# Patient Record
Sex: Male | Born: 1990 | Race: White | Hispanic: No | Marital: Single | Smoking: Current every day smoker
Health system: Southern US, Community
[De-identification: ages and names within clinical notes are randomized; demographics above are authoritative.]

## PROBLEM LIST (undated history)

## (undated) DIAGNOSIS — F329 Major depressive disorder, single episode, unspecified: Secondary | ICD-10-CM

## (undated) DIAGNOSIS — J45909 Unspecified asthma, uncomplicated: Secondary | ICD-10-CM

## (undated) DIAGNOSIS — E119 Type 2 diabetes mellitus without complications: Secondary | ICD-10-CM

## (undated) DIAGNOSIS — T7840XA Allergy, unspecified, initial encounter: Secondary | ICD-10-CM

## (undated) DIAGNOSIS — F32A Depression, unspecified: Secondary | ICD-10-CM

## (undated) DIAGNOSIS — R569 Unspecified convulsions: Secondary | ICD-10-CM

## (undated) DIAGNOSIS — I10 Essential (primary) hypertension: Secondary | ICD-10-CM

## (undated) DIAGNOSIS — B9681 Helicobacter pylori [H. pylori] as the cause of diseases classified elsewhere: Secondary | ICD-10-CM

## (undated) DIAGNOSIS — F419 Anxiety disorder, unspecified: Secondary | ICD-10-CM

## (undated) HISTORY — PX: TONSILLECTOMY: SUR1361

## (undated) HISTORY — DX: Type 2 diabetes mellitus without complications: E11.9

## (undated) HISTORY — DX: Allergy, unspecified, initial encounter: T78.40XA

## (undated) HISTORY — PX: TYMPANOSTOMY TUBE PLACEMENT: SHX32

## (undated) HISTORY — DX: Anxiety disorder, unspecified: F41.9

## (undated) HISTORY — DX: Unspecified asthma, uncomplicated: J45.909

## (undated) HISTORY — DX: Unspecified convulsions: R56.9

## (undated) HISTORY — DX: Depression, unspecified: F32.A

---
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
## 2019-01-26 ENCOUNTER — Other Ambulatory Visit: Payer: Self-pay

## 2019-01-26 ENCOUNTER — Encounter: Payer: Self-pay | Admitting: Nurse Practitioner

## 2019-01-26 ENCOUNTER — Ambulatory Visit: Payer: Medicaid Other | Admitting: Nurse Practitioner

## 2019-01-26 VITALS — BP 116/72 | HR 84 | Temp 98.3°F | Ht 65.0 in | Wt 171.0 lb

## 2019-01-26 DIAGNOSIS — Z9189 Other specified personal risk factors, not elsewhere classified: Secondary | ICD-10-CM | POA: Insufficient documentation

## 2019-01-26 DIAGNOSIS — F319 Bipolar disorder, unspecified: Secondary | ICD-10-CM | POA: Insufficient documentation

## 2019-01-26 DIAGNOSIS — Z1329 Encounter for screening for other suspected endocrine disorder: Secondary | ICD-10-CM | POA: Diagnosis not present

## 2019-01-26 DIAGNOSIS — I10 Essential (primary) hypertension: Secondary | ICD-10-CM | POA: Insufficient documentation

## 2019-01-26 DIAGNOSIS — G40909 Epilepsy, unspecified, not intractable, without status epilepticus: Secondary | ICD-10-CM | POA: Insufficient documentation

## 2019-01-26 DIAGNOSIS — F3161 Bipolar disorder, current episode mixed, mild: Secondary | ICD-10-CM

## 2019-01-26 DIAGNOSIS — E119 Type 2 diabetes mellitus without complications: Secondary | ICD-10-CM

## 2019-01-26 MED ORDER — CLONIDINE HCL 0.1 MG PO TABS: 0.1000 mg | ORAL_TABLET | Freq: Two times a day (BID) | ORAL | 1 refills | Status: DC | PRN

## 2019-01-26 MED ORDER — QUETIAPINE FUMARATE 25 MG PO TABS: 25.0000 mg | ORAL_TABLET | Freq: Every day | ORAL | 3 refills | Status: DC

## 2019-01-26 MED ORDER — GABAPENTIN 400 MG PO CAPS: 400.0000 mg | ORAL_CAPSULE | Freq: Three times a day (TID) | ORAL | 3 refills | Status: DC

## 2019-01-26 MED ORDER — LAMOTRIGINE 200 MG PO TABS: 200.0000 mg | ORAL_TABLET | Freq: Two times a day (BID) | ORAL | 3 refills | Status: DC

## 2019-01-26 MED ORDER — LISINOPRIL 10 MG PO TABS: 10.0000 mg | ORAL_TABLET | Freq: Every day | ORAL | 3 refills | Status: DC

## 2019-01-26 MED ORDER — VIIBRYD 20 MG PO TABS: 20.0000 mg | ORAL_TABLET | Freq: Every day | ORAL | 3 refills | Status: DC

## 2019-01-26 NOTE — Assessment & Plan Note (Signed)
Ongoing, continue Viibryd and Lamictal.  Discontinue Vraylar and Mirtazapine, return to Seroquel which benefited patient in past with mood and sleep.  No longer taking Xanax, will discontinue this and recommended continued non use of this.  Continue Clonidine as needed for panic.  Consider psychiatry referral if ongoing symptoms that are poorly controlled.  He denies SI/HI.

## 2019-01-26 NOTE — Assessment & Plan Note (Signed)
Ongoing, stable with BP below goal today.  Continue Lisinopril which offers kidney protection.  Monitor BP at home three mornings a week.  CMP today.

## 2019-01-26 NOTE — Progress Notes (Signed)
New Patient Office Visit  Subjective:  Patient ID: Michael Woodward, male    DOB: 10/19/1990  Age: 28 y.o. MRN: [id number]  CC:  Chief Complaint  Patient presents with   Establish Care    pt would like to discuss about high BP and blood sugar    HPI Michael Woodward presents for new patient visit to establish care.  Introduced to Designer, jewellery role and practice setting.  All questions answered.  Just moved here from [facility].  His husband just passed away, his husband was 20.  He passed 8 months ago and since this time he does endorse some grieving, but overall doing well.  Was receiving primary care in [facility] and recently moved to this area.  Is on disability due to Bipolar Disorder.    BIPOLAR DISORDER Has been on Viibryd 20 MG since January and this works well.  Denies racing thoughts or SI/HI.  Has used Trazodone before, but this was not beneficial.  States he currently takes Dietitian, but preferred Seroquel in past which he took at night for sleep and mood.  Currently taking Mirtazapine and does not like it, reports it does not work as well and he eats all of the time.  Uses Clonidine as needed of panic attacks, tried Xanax but did not like the way it made him feel, wishes to discontinue this.  Has 10 pills of Xanax left at home and does not plan on using them.  Reports his Bipolar shifts from depression to periods of manic, but is well-controlled at this time on Viirbryd. Mood status: stable Satisfied with current treatment?: yes Symptom severity: mild  Duration of current treatment : chronic Side effects: no Medication compliance: good compliance Psychotherapy/counseling:  none Previous psychiatric medications: multiple different medications Depressed mood: sometimes  Anxious mood: occasionally Anhedonia: no Significant weight loss or gain: no Insomnia: yes hard to fall asleep Fatigue: no Feelings of worthlessness or guilt: no Impaired concentration/indecisiveness:  no Suicidal ideations: no Hopelessness: no Crying spells: no Depression screen PHQ 2/9 01/26/2019  Decreased Interest 0  Down, Depressed, Hopeless 1  PHQ - 2 Score 1  Altered sleeping 2  Tired, decreased energy 2  Change in appetite 2  Feeling bad or failure about yourself  2  Trouble concentrating 1  Moving slowly or fidgety/restless 2  Suicidal thoughts 0  PHQ-9 Score 12    GAD 7 : Generalized Anxiety Score 01/26/2019  Nervous, Anxious, on Edge 1  Control/stop worrying 1  Worry too much - different things 1  Trouble relaxing 2  Restless 2  Easily annoyed or irritable 1  Afraid - awful might happen 0  Total GAD 7 Score 8  Anxiety Difficulty Somewhat difficult   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  Does not recall if had concussion first and then had seizure or vice versa.  Was placed on multiple different types of seizure medication, has been on Lamictal for 4 years.  He recalls his last seizure as last July 5th, missed dose of Lamictal.  He endorses if he missed dose then he will have seizure.  States the Lamictal both benefits his Bipolar and seizures.  Does not recall recent blood level.  DIABETES Last A1C at beginning of this year was 4.6%.  Was diagnosed March 2019, was 238 lbs and now is 171 lbs, has changed eating habits.  Was on Actos in past, until he started having hypoglycemia 40-50's.  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  Hypoglycemic episodes:no Polydipsia/polyuria: no Visual disturbance: no Chest pain: no Paresthesias: no Glucose Monitoring: yes  Accucheck frequency: Daily  Fasting glucose: 90's  Post prandial:  Evening:  Before meals: Taking Insulin?: no  Long acting insulin:  Short acting insulin: Blood Pressure Monitoring: not checking Retinal Examination: Not up to Date , not since last year Foot Exam: Not up to Date Pneumovax:  refuses Influenza: refuses Aspirin: no  Past Medical History:  Diagnosis Date   Allergy    Anxiety    Asthma    Depression    Diabetes mellitus without complication (HCC)    Seizures (HCC)     Past Surgical History:  Procedure Laterality Date   TONSILLECTOMY     TYMPANOSTOMY TUBE PLACEMENT      Family History  Problem Relation Age of Onset   Hypertension Mother    Diabetes Mother    COPD Maternal Grandmother     Social History   Socioeconomic History   Marital status: Single    Spouse name: Not on file   Number of children: Not on file   Years of education: Not on file   Highest education level: Not on file  Occupational History   Not on file  Social Needs   Financial resource strain: Very hard   Food insecurity    Worry: Never true    Inability: Never true   Transportation needs    Medical: No    Non-medical: No  Tobacco Use   Smoking status: Former Smoker    Packs/day: 2.00    Types: Cigarettes   Smokeless tobacco: Never Used  Substance and Sexual Activity   Alcohol use: Not Currently   Drug use: Not Currently   Sexual activity: Not Currently  Lifestyle   Physical activity    Days per week: 6 days    Minutes per session: 30 min   Stress: Only a little  Relationships   Social connections    Talks on phone: More than three times a week    Gets together: More than three times a week    Attends religious service: Never    Active member of club or organization: No    Attends meetings of clubs or organizations: Never    Relationship status: Not on file   Intimate partner violence    Fear of current or ex partner: No    Emotionally abused: No    Physically abused: No    Forced sexual activity: No  Other Topics Concern   Not on file  Social History Narrative   Not on file    ROS Review of Systems  Objective:   Today's Vitals: BP 116/72    Pulse 84    Temp 98.3 F (36.8 C) (Oral)    Ht 5\' 5"  (1.651 m)    Wt 171  lb (77.6 kg)    SpO2 99%    BMI 28.46 kg/m   Physical Exam  Assessment & Plan:   Problem List Items Addressed This Visit      Cardiovascular and Mediastinum   Essential hypertension    Ongoing, stable with BP below goal today.  Continue Lisinopril which offers kidney protection.  Monitor BP at home three mornings a week.  CMP today.      Relevant Medications   lisinopril (ZESTRIL) 10 MG tablet   cloNIDine (CATAPRES) 0.1 MG tablet     Endocrine   Type  2 diabetes mellitus without complication, without long-term current use of insulin (HCC) - Primary    Ongoing and stable with reported recent A1C of 4.6% per patient.  Continue off medication and focus on diet/exercise.  Obtain A1C, urine micro, CMP, lipid panel.  Referral to ophthalmology for diabetic eye exam.  Recommend continue to monitor BS at home daily.  Return in 4 weeks.       Relevant Medications   lisinopril (ZESTRIL) 10 MG tablet   Other Relevant Orders   Ambulatory referral to Ophthalmology   Microalbumin, Urine Waived   Comprehensive metabolic panel   Lipid Panel w/o Chol/HDL Ratio   HgB A1c     Nervous and Auditory   Seizure disorder (HCC)    Ongoing, well-controlled on Lamictal.  Recommend he take this every day and use pill box as reminder.  Obtain level today.  Consider neurology referral if increased seizure activity.      Relevant Medications   lamoTRIgine (LAMICTAL) 200 MG tablet   gabapentin (NEURONTIN) 400 MG capsule   Other Relevant Orders   Lamotrigine level     Other   Bipolar disorder (HCC)    Ongoing, continue Viibryd and Lamictal.  Discontinue Vraylar and Mirtazapine, return to Seroquel which benefited patient in past with mood and sleep.  No longer taking Xanax, will discontinue this and recommended continued non use of this.  Continue Clonidine as needed for panic.  Consider psychiatry referral if ongoing symptoms that are poorly controlled.  He denies SI/HI.      At risk for HIV due to  homosexual contact    Currently not sexually active, but at risk due to homosexuality.  Monitor sexual activity and recommend safe sex.  Obtain HIV testing next visit.       Other Visit Diagnoses    Thyroid disorder screen       Relevant Orders   TSH      Outpatient Encounter Medications as of 01/26/2019  Medication Sig   cloNIDine (CATAPRES) 0.1 MG tablet Take 1 tablet (0.1 mg total) by mouth 2 (two) times daily as needed (for panic attack).   gabapentin (NEURONTIN) 400 MG capsule Take 1 capsule (400 mg total) by mouth 3 (three) times daily.   lamoTRIgine (LAMICTAL) 200 MG tablet Take 1 tablet (200 mg total) by mouth 2 (two) times daily.   lisinopril (ZESTRIL) 10 MG tablet Take 1 tablet (10 mg total) by mouth daily.   Multiple Vitamin (MULTIVITAMIN PO) Take by mouth daily.   VIIBRYD 20 MG TABS Take 1 tablet (20 mg total) by mouth daily with breakfast.   [DISCONTINUED] cloNIDine (CATAPRES) 0.1 MG tablet TK 1 T PO BID PRF PANIC   [DISCONTINUED] gabapentin (NEURONTIN) 400 MG capsule TK ONE C PO TID   [DISCONTINUED] lamoTRIgine (LAMICTAL) 200 MG tablet TK 1 T PO BID   [DISCONTINUED] lisinopril (ZESTRIL) 10 MG tablet TK 1 T PO QD   [DISCONTINUED] mirtazapine (REMERON) 30 MG tablet TK 1/2 TO 1 T PO HS FOR SLP   [DISCONTINUED] VIIBRYD 20 MG TABS TK 1 T PO QAM WC   [DISCONTINUED] VRAYLAR capsule TK ONE C PO QHS   QUEtiapine (SEROQUEL) 25 MG tablet Take 1 tablet (25 mg total) by mouth at bedtime.   [DISCONTINUED] ALPRAZolam (XANAX) 0.5 MG tablet Take 0.5 mg by mouth at bedtime as needed for anxiety.   No facility-administered encounter medications on file as of 01/26/2019.     Follow-up: Return in about 4 weeks (around 02/23/2019) for Follow-up .  Marjie SkiffJOLENE T Timya Trimmer, NP

## 2019-01-26 NOTE — Assessment & Plan Note (Signed)
Ongoing and stable with reported recent A1C of 4.6% per patient.  Continue off medication and focus on diet/exercise.  Obtain A1C, urine micro, CMP, lipid panel.  Referral to ophthalmology for diabetic eye exam.  Recommend continue to monitor BS at home daily.  Return in 4 weeks.

## 2019-01-26 NOTE — Assessment & Plan Note (Signed)
Ongoing, well-controlled on Lamictal.  Recommend he take this every day and use pill box as reminder.  Obtain level today.  Consider neurology referral if increased seizure activity.

## 2019-01-26 NOTE — Assessment & Plan Note (Signed)
Currently not sexually active, but at risk due to homosexuality.  Monitor sexual activity and recommend safe sex.  Obtain HIV testing next visit.

## 2019-01-26 NOTE — Patient Instructions (Signed)
Carbohydrate Counting for Diabetes Mellitus, Adult  Carbohydrate counting is a method of keeping track of how many carbohydrates you eat. Eating carbohydrates naturally increases the amount of sugar (glucose) in the blood. Counting how many carbohydrates you eat helps keep your blood glucose within normal limits, which helps you manage your diabetes (diabetes mellitus). It is important to know how many carbohydrates you can safely have in each meal. This is different for every person. A diet and nutrition specialist (registered dietitian) can help you make a meal plan and calculate how many carbohydrates you should have at each meal and snack. Carbohydrates are found in the following foods:  Grains, such as breads and cereals.  Dried beans and soy products.  Starchy vegetables, such as potatoes, peas, and corn.  Fruit and fruit juices.  Milk and yogurt.  Sweets and snack foods, such as cake, cookies, candy, chips, and soft drinks. How do I count carbohydrates? There are two ways to count carbohydrates in food. You can use either of the methods or a combination of both. Reading "Nutrition Facts" on packaged food The "Nutrition Facts" list is included on the labels of almost all packaged foods and beverages in the [place] It includes:  The serving size.  Information about nutrients in each serving, including the grams (g) of carbohydrate per serving. To use the "Nutrition Facts":  Decide how many servings you will have.  Multiply the number of servings by the number of carbohydrates per serving.  The resulting number is the total amount of carbohydrates that you will be having. Learning standard serving sizes of other foods When you eat carbohydrate foods that are not packaged or do not include "Nutrition Facts" on the label, you need to measure the servings in order to count the amount of carbohydrates:  Measure the foods that you will eat with a food scale or measuring cup, if needed.   Decide how many standard-size servings you will eat.  Multiply the number of servings by 15. Most carbohydrate-rich foods have about 15 g of carbohydrates per serving. ? For example, if you eat 8 oz (170 g) of strawberries, you will have eaten 2 servings and 30 g of carbohydrates (2 servings x 15 g = 30 g).  For foods that have more than one food mixed, such as soups and casseroles, you must count the carbohydrates in each food that is included. The following list contains standard serving sizes of common carbohydrate-rich foods. Each of these servings has about 15 g of carbohydrates:   hamburger bun or  English muffin.   oz (15 mL) syrup.   oz (14 g) jelly.  1 slice of bread.  1 six-inch tortilla.  3 oz (85 g) cooked rice or pasta.  4 oz (113 g) cooked dried beans.  4 oz (113 g) starchy vegetable, such as peas, corn, or potatoes.  4 oz (113 g) hot cereal.  4 oz (113 g) mashed potatoes or  of a large baked potato.  4 oz (113 g) canned or frozen fruit.  4 oz (120 mL) fruit juice.  4-6 crackers.  6 chicken nuggets.  6 oz (170 g) unsweetened dry cereal.  6 oz (170 g) plain fat-free yogurt or yogurt sweetened with artificial sweeteners.  8 oz (240 mL) milk.  8 oz (170 g) fresh fruit or one small piece of fruit.  24 oz (680 g) popped popcorn. Example of carbohydrate counting Sample meal  3 oz (85 g) chicken breast.  6 oz (170 g)   brown rice.  4 oz (113 g) corn.  8 oz (240 mL) milk.  8 oz (170 g) strawberries with sugar-free whipped topping. Carbohydrate calculation 1. Identify the foods that contain carbohydrates: ? Rice. ? Corn. ? Milk. ? Strawberries. 2. Calculate how many servings you have of each food: ? 2 servings rice. ? 1 serving corn. ? 1 serving milk. ? 1 serving strawberries. 3. Multiply each number of servings by 15 g: ? 2 servings rice x 15 g = 30 g. ? 1 serving corn x 15 g = 15 g. ? 1 serving milk x 15 g = 15 g. ? 1 serving  strawberries x 15 g = 15 g. 4. Add together all of the amounts to find the total grams of carbohydrates eaten: ? 30 g + 15 g + 15 g + 15 g = 75 g of carbohydrates total. Summary  Carbohydrate counting is a method of keeping track of how many carbohydrates you eat.  Eating carbohydrates naturally increases the amount of sugar (glucose) in the blood.  Counting how many carbohydrates you eat helps keep your blood glucose within normal limits, which helps you manage your diabetes.  A diet and nutrition specialist (registered dietitian) can help you make a meal plan and calculate how many carbohydrates you should have at each meal and snack. This information is not intended to replace advice given to you by your health care provider. Make sure you discuss any questions you have with your health care provider. Document Released: 06/04/2005 Document Revised: 12/27/2016 Document Reviewed: 11/16/2015 Elsevier Patient Education  2020 [employer]  

## 2019-01-27 LAB — COMPREHENSIVE METABOLIC PANEL
ALT: 14 IU/L (ref 0–44)
AST: 14 IU/L (ref 0–40)
Albumin/Globulin Ratio: 2.2 (ref 1.2–2.2)
Albumin: 5 g/dL (ref 4.1–5.2)
Alkaline Phosphatase: 78 IU/L (ref 39–117)
BUN/Creatinine Ratio: 18 (ref 9–20)
BUN: 15 mg/dL (ref 6–20)
Bilirubin Total: <0.2 mg/dL (ref 0.0–1.2)
CO2: 22 mmol/L (ref 20–29)
Calcium: 10 mg/dL (ref 8.7–10.2)
Chloride: 100 mmol/L (ref 96–106)
Creatinine, Ser: 0.83 mg/dL (ref 0.76–1.27)
GFR calc Af Amer: 139 mL/min/{1.73_m2} (ref >59)
GFR calc non Af Amer: 121 mL/min/{1.73_m2} (ref >59)
Globulin, Total: 2.3 g/dL (ref 1.5–4.5)
Glucose: 75 mg/dL (ref 65–99)
Potassium: 5 mmol/L (ref 3.5–5.2)
Sodium: 139 mmol/L (ref 134–144)
Total Protein: 7.3 g/dL (ref 6.0–8.5)

## 2019-01-27 LAB — LIPID PANEL W/O CHOL/HDL RATIO
Cholesterol, Total: 170 mg/dL (ref 100–199)
HDL: 36 mg/dL — ABNORMAL LOW (ref >39)
LDL Calculated: 90 mg/dL (ref 0–99)
Triglycerides: 222 mg/dL — ABNORMAL HIGH (ref 0–149)
VLDL Cholesterol Cal: 44 mg/dL — ABNORMAL HIGH (ref 5–40)

## 2019-01-27 LAB — TSH: TSH: 1.01 u[IU]/mL (ref 0.450–4.500)

## 2019-01-27 LAB — HEMOGLOBIN A1C
Est. average glucose Bld gHb Est-mCnc: 97 mg/dL
Hgb A1c MFr Bld: 5 % (ref 4.8–5.6)

## 2019-01-27 LAB — MICROALBUMIN, URINE WAIVED
Creatinine, Urine Waived: 50 mg/dL (ref 10–300)
Microalb, Ur Waived: 10 mg/L (ref 0–19)
Microalb/Creat Ratio: <30 mg/g (ref <30)

## 2019-03-06 ENCOUNTER — Ambulatory Visit: Payer: Medicaid Other | Admitting: Nurse Practitioner

## 2019-03-06 ENCOUNTER — Other Ambulatory Visit: Payer: Self-pay

## 2019-03-06 ENCOUNTER — Encounter: Payer: Self-pay | Admitting: Nurse Practitioner

## 2019-03-06 VITALS — BP 107/65 | HR 67 | Temp 98.1°F | Ht 65.0 in | Wt 166.0 lb

## 2019-03-06 DIAGNOSIS — G43909 Migraine, unspecified, not intractable, without status migrainosus: Secondary | ICD-10-CM | POA: Insufficient documentation

## 2019-03-06 DIAGNOSIS — Z23 Encounter for immunization: Secondary | ICD-10-CM | POA: Diagnosis not present

## 2019-03-06 DIAGNOSIS — G40909 Epilepsy, unspecified, not intractable, without status epilepticus: Secondary | ICD-10-CM

## 2019-03-06 DIAGNOSIS — G43109 Migraine with aura, not intractable, without status migrainosus: Secondary | ICD-10-CM | POA: Diagnosis not present

## 2019-03-06 DIAGNOSIS — I10 Essential (primary) hypertension: Secondary | ICD-10-CM

## 2019-03-06 DIAGNOSIS — E119 Type 2 diabetes mellitus without complications: Secondary | ICD-10-CM | POA: Diagnosis not present

## 2019-03-06 DIAGNOSIS — Z9189 Other specified personal risk factors, not elsewhere classified: Secondary | ICD-10-CM

## 2019-03-06 MED ORDER — RIZATRIPTAN BENZOATE 5 MG PO TABS: 5.0000 mg | ORAL_TABLET | ORAL | 0 refills | Status: DC | PRN

## 2019-03-06 NOTE — Assessment & Plan Note (Signed)
Ongoing, well-controlled on Lamictal.  Recommend he take this every day and use pill box as reminder.  Obtain level today.  Consider neurology referral if increased seizure activity.

## 2019-03-06 NOTE — Assessment & Plan Note (Signed)
Ongoing, stable with BP below goal today.  Continue Lisinopril which offers kidney protection.  Monitor BP at home three mornings a week.

## 2019-03-06 NOTE — Assessment & Plan Note (Addendum)
Chronic issue, has not taken medication in long while, but migraines returned over past year.  Currently he does not feel need for preventative and would be cautious using Amitriptyline due to him being on multiple psych medications.  Script sent for Maxalt to use as needed.  Return in 4 weeks.

## 2019-03-06 NOTE — Assessment & Plan Note (Signed)
Declines HIV testing today, recommended this.  At this time not sexually active and reports negative test one year ago.  He is aware of need for testing if becomes sexually active.

## 2019-03-06 NOTE — Progress Notes (Addendum)
BP 107/65    Pulse 67    Temp 98.1 F (36.7 C) (Oral)    Ht 5\' 5"  (1.651 m)    Wt 166 lb (75.3 kg)    SpO2 99%    BMI 27.62 kg/m    Subjective:    Patient ID: Michael Woodward, male    DOB: 1991/05/20, 28 y.o.   MRN: [id number]  HPI: Michael Woodward is a 28 y.o. male  Chief Complaint  Patient presents with   Diabetes   Hypertension   DIABETES Last A1C at beginning of this year was 5.0%.  Was diagnosed March 2019, was 238 lbs and now is 171 lbs, has changed eating habits.  Was on Actos in past, until he started having hypoglycemia 40-50's.  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  Hypoglycemic episodes:no Polydipsia/polyuria: no Visual disturbance: no Chest pain: no Paresthesias: no Glucose Monitoring: yes  Accucheck frequency: Daily  Fasting glucose: 98 to 120  Post prandial:  Evening:  Before meals: Taking Insulin?: no  Long acting insulin:  Short acting insulin: Blood Pressure Monitoring: not checking Retinal Examination: October 4th, 2020 Foot Exam: Up to Date Pneumovax: Up to Date Influenza: Up to Date Aspirin: no   HYPERTENSION Continues on Lisinopril 10 MG daily. Hypertension status: stable  Satisfied with current treatment? yes Duration of hypertension: chronic BP monitoring frequency:  not checking BP range:  BP medication side effects:  no Medication compliance: good compliance Aspirin: no Recurrent headaches: no Visual changes: no Palpitations: no Dyspnea: no Chest pain: no Lower extremity edema: no Dizzy/lightheaded: no   SEIZURE DISORDER: Diagnosed in high school, does not recall what happened.  Does not recall if had concussion first and then had seizure or vice versa.  Was placed on multiple different types of seizure medication, has been on Lamictal for 4 years.  He recalls his last seizure as July 5th 2019, missed dose of Lamictal. He reports if he misses dose  then he will have seizure.  States the Lamictal both benefits his Bipolar and seizures.  Does not recall recent blood level.  MIGRAINES Started having them in high school.  Used to take Amitriptyline.  Stopped in 2014 and migraines have returned over the past year.   Duration: years Onset: gradual Frequency: intermittent Location: frontal and into eyes Headache duration: Radiation: no Time of day headache occurs: varies Alleviating factors: nothing Aggravating factors: unknown Headache status at time of visit: asymptomatic Treatments attempted: Treatments attempted: amitriptyline   Aura: yes Nausea:  yes Vomiting: yes Photophobia:  yes Phonophobia:  yes Effect on social functioning:  yes Numbers of missed days of school/work each month: none Confusion:  no Gait disturbance/ataxia:  no Behavioral changes:  no Fevers:  no  Relevant past medical, surgical, family and social history reviewed and updated as indicated. Interim medical history since our last visit reviewed. Allergies and medications reviewed and updated.  Review of Systems  Constitutional: Negative for activity change, diaphoresis, fatigue and fever.  Respiratory: Negative for cough, chest tightness, shortness of breath and wheezing.   Cardiovascular: Negative for chest pain, palpitations and leg swelling.  Gastrointestinal: Negative for abdominal distention, abdominal pain, constipation, diarrhea, nausea and vomiting.  Endocrine: Negative for cold intolerance, heat intolerance, polydipsia, polyphagia and polyuria.  Neurological: Negative for dizziness, syncope, weakness, light-headedness, numbness and headaches (not present today).    Per HPI unless specifically indicated above     Objective:  BP 107/65    Pulse 67    Temp 98.1 F (36.7 C) (Oral)    Ht 5\' 5"  (1.651 m)    Wt 166 lb (75.3 kg)    SpO2 99%    BMI 27.62 kg/m   Wt Readings from Last 3 Encounters:  03/06/19 166 lb (75.3 kg)  01/26/19 171 lb  (77.6 kg)    Physical Exam Vitals signs and nursing note reviewed.  Constitutional:      General: He is awake. He is not in acute distress.    Appearance: He is well-developed. He is not ill-appearing.  HENT:     Head: Normocephalic and atraumatic.     Right Ear: Hearing normal. No drainage.     Left Ear: Hearing normal. No drainage.     Mouth/Throat:     Pharynx: Uvula midline.  Eyes:     General: Lids are normal.        Right eye: No discharge.        Left eye: No discharge.     Conjunctiva/sclera: Conjunctivae normal.     Pupils: Pupils are equal, round, and reactive to light.  Neck:     Musculoskeletal: Normal range of motion and neck supple.     Thyroid: No thyromegaly.     Vascular: No carotid bruit.  Cardiovascular:     Rate and Rhythm: Normal rate and regular rhythm.     Heart sounds: Normal heart sounds, S1 normal and S2 normal. No murmur. No gallop.   Pulmonary:     Effort: Pulmonary effort is normal. No accessory muscle usage or respiratory distress.     Breath sounds: Normal breath sounds.  Abdominal:     General: Bowel sounds are normal.     Palpations: Abdomen is soft.  Musculoskeletal: Normal range of motion.     Right lower leg: No edema.     Left lower leg: No edema.  Skin:    General: Skin is warm and dry.  Neurological:     Mental Status: He is alert and oriented to person, place, and time.     Cranial Nerves: Cranial nerves are intact.     Deep Tendon Reflexes: Reflexes are normal and symmetric.     Reflex Scores:      Brachioradialis reflexes are 2+ on the right side and 2+ on the left side.      Patellar reflexes are 2+ on the right side and 2+ on the left side. Psychiatric:        Mood and Affect: Mood normal.        Behavior: Behavior normal. Behavior is cooperative.        Thought Content: Thought content normal.        Judgment: Judgment normal.    Diabetic Foot Exam - Simple   Simple Foot Form Visual Inspection See comments:  Yes Sensation Testing Intact to touch and monofilament testing bilaterally: Yes Pulse Check Posterior Tibialis and Dorsalis pulse intact bilaterally: Yes Comments Onychomycosis bilaterally.    Results for orders placed or performed in visit on 01/26/19  Microalbumin, Urine Waived  Result Value Ref Range   Microalb, Ur Waived 10 0 - 19 mg/L   Creatinine, Urine Waived 50 10 - 300 mg/dL   Microalb/Creat Ratio <30 <30 mg/g  Comprehensive metabolic panel  Result Value Ref Range   Glucose 75 65 - 99 mg/dL   BUN 15 6 - 20 mg/dL   Creatinine, Ser 0.83 0.76 - 1.27 mg/dL   GFR  calc non Af Amer 121 >59 mL/min/1.73   GFR calc Af Amer 139 >59 mL/min/1.73   BUN/Creatinine Ratio 18 9 - 20   Sodium 139 134 - 144 mmol/L   Potassium 5.0 3.5 - 5.2 mmol/L   Chloride 100 96 - 106 mmol/L   CO2 22 20 - 29 mmol/L   Calcium 10.0 8.7 - 10.2 mg/dL   Total Protein 7.3 6.0 - 8.5 g/dL   Albumin 5.0 4.1 - 5.2 g/dL   Globulin, Total 2.3 1.5 - 4.5 g/dL   Albumin/Globulin Ratio 2.2 1.2 - 2.2   Bilirubin Total <0.2 0.0 - 1.2 mg/dL   Alkaline Phosphatase 78 39 - 117 IU/L   AST 14 0 - 40 IU/L   ALT 14 0 - 44 IU/L  TSH  Result Value Ref Range   TSH 1.010 0.450 - 4.500 uIU/mL  Lipid Panel w/o Chol/HDL Ratio  Result Value Ref Range   Cholesterol, Total 170 100 - 199 mg/dL   Triglycerides 161222 (H) 0 - 149 mg/dL   HDL 36 (L) >09>39 mg/dL   VLDL Cholesterol Cal 44 (H) 5 - 40 mg/dL   LDL Calculated 90 0 - 99 mg/dL  HgB U0AA1c  Result Value Ref Range   Hgb A1c MFr Bld 5.0 4.8 - 5.6 %   Est. average glucose Bld gHb Est-mCnc 97 mg/dL      Assessment & Plan:   Problem List Items Addressed This Visit      Cardiovascular and Mediastinum   Essential hypertension    Ongoing, stable with BP below goal today.  Continue Lisinopril which offers kidney protection.  Monitor BP at home three mornings a week.        Migraines    Chronic issue, has not taken medication in long while, but migraines returned over past year.   Currently he does not feel need for preventative and would be cautious using Amitriptyline due to him being on multiple psych medications.  Script sent for Maxalt to use as needed.  Return in 4 weeks.      Relevant Medications   rizatriptan (MAXALT) 5 MG tablet     Endocrine   Type 2 diabetes mellitus without complication, without long-term current use of insulin (HCC) - Primary    Ongoing and stable with A1C 5.0% recent labs.  Continue off medication and focus on diet/exercise.   Recommend continue to monitor BS at home daily.  Praised for success.        Nervous and Auditory   Seizure disorder (HCC)    Ongoing, well-controlled on Lamictal.  Recommend he take this every day and use pill box as reminder.  Obtain level today.  Consider neurology referral if increased seizure activity.      Relevant Medications   rizatriptan (MAXALT) 5 MG tablet   Other Relevant Orders   Lamotrigine level     Other   At risk for HIV due to homosexual contact    Declines HIV testing today, recommended this.  At this time not sexually active and reports negative test one year ago.  He is aware of need for testing if becomes sexually active.       Other Visit Diagnoses    Flu vaccine need       Relevant Orders   Flu Vaccine QUAD 36+ mos IM (Completed)   Need for pneumococcal vaccine       Relevant Orders   Pneumococcal polysaccharide vaccine 23-valent greater than or equal to 2yo subcutaneous/IM (Completed)  Follow up plan: Return in about 4 weeks (around 04/03/2019) for Migraines.

## 2019-03-06 NOTE — Assessment & Plan Note (Signed)
Ongoing and stable with A1C 5.0% recent labs.  Continue off medication and focus on diet/exercise.   Recommend continue to monitor BS at home daily.  Praised for success.

## 2019-03-06 NOTE — Patient Instructions (Signed)

## 2019-03-09 LAB — LAMOTRIGINE LEVEL: Lamotrigine Lvl: 8.3 ug/mL (ref 2.0–20.0)

## 2019-03-23 LAB — HM DIABETES EYE EXAM

## 2019-04-10 ENCOUNTER — Other Ambulatory Visit: Payer: Self-pay

## 2019-04-10 ENCOUNTER — Encounter: Payer: Self-pay | Admitting: Nurse Practitioner

## 2019-04-10 ENCOUNTER — Ambulatory Visit (INDEPENDENT_AMBULATORY_CARE_PROVIDER_SITE_OTHER): Payer: Medicaid Other | Admitting: Nurse Practitioner

## 2019-04-10 VITALS — BP 102/72 | HR 86 | Temp 98.0°F | Wt 165.0 lb

## 2019-04-10 DIAGNOSIS — F3161 Bipolar disorder, current episode mixed, mild: Secondary | ICD-10-CM | POA: Diagnosis not present

## 2019-04-10 DIAGNOSIS — I10 Essential (primary) hypertension: Secondary | ICD-10-CM

## 2019-04-10 DIAGNOSIS — G43109 Migraine with aura, not intractable, without status migrainosus: Secondary | ICD-10-CM

## 2019-04-10 MED ORDER — QUETIAPINE FUMARATE 25 MG PO TABS: 25.0000 mg | ORAL_TABLET | Freq: Every day | ORAL | 3 refills | Status: DC

## 2019-04-10 MED ORDER — LAMOTRIGINE 200 MG PO TABS: 200.0000 mg | ORAL_TABLET | Freq: Two times a day (BID) | ORAL | 3 refills | Status: DC

## 2019-04-10 MED ORDER — QUETIAPINE FUMARATE 50 MG PO TABS: 50.0000 mg | ORAL_TABLET | Freq: Every day | ORAL | 3 refills | Status: DC

## 2019-04-10 MED ORDER — LISINOPRIL 2.5 MG PO TABS: 2.5000 mg | ORAL_TABLET | Freq: Every day | ORAL | 3 refills | Status: DC

## 2019-04-10 NOTE — Assessment & Plan Note (Signed)
Chronic, stable with improvement reported.  Minimal use of Maxalt, continue current regimen and adjust as needed.  Would avoid Amitriptyline due to pt being on multiple psych medications.

## 2019-04-10 NOTE — Patient Instructions (Signed)
Living With Depression Everyone experiences occasional disappointment, sadness, and loss in their lives. When you are feeling down, blue, or sad for at least 2 weeks in a row, it may mean that you have depression. Depression can affect your thoughts and feelings, relationships, daily activities, and physical health. It is caused by changes in the way your brain functions. If you receive a diagnosis of depression, your health care provider will tell you which type of depression you have and what treatment options are available to you. If you are living with depression, there are ways to help you recover from it and also ways to prevent it from coming back. How to cope with lifestyle changes Coping with stress     Stress is your body's reaction to life changes and events, both good and bad. Stressful situations may include:  Getting married.  The death of a spouse.  Losing a job.  Retiring.  Having a baby. Stress can last just a few hours or it can be ongoing. Stress can play a major role in depression, so it is important to learn both how to cope with stress and how to think about it differently. Talk with your health care provider or a counselor if you would like to learn more about stress reduction. He or she may suggest some stress reduction techniques, such as:  Music therapy. This can include creating music or listening to music. Choose music that you enjoy and that inspires you.  Mindfulness-based meditation. This kind of meditation can be done while sitting or walking. It involves being aware of your normal breaths, rather than trying to control your breathing.  Centering prayer. This is a kind of meditation that involves focusing on a spiritual word or phrase. Choose a word, phrase, or sacred image that is meaningful to you and that brings you peace.  Deep breathing. To do this, expand your stomach and inhale slowly through your nose. Hold your breath for 3-5 seconds, then exhale  slowly, allowing your stomach muscles to relax.  Muscle relaxation. This involves intentionally tensing muscles then relaxing them. Choose a stress reduction technique that fits your lifestyle and personality. Stress reduction techniques take time and practice to develop. Set aside 5-15 minutes a day to do them. Therapists can offer training in these techniques. The training may be covered by some insurance plans. Other things you can do to manage stress include:  Keeping a stress diary. This can help you learn what triggers your stress and ways to control your response.  Understanding what your limits are and saying no to requests or events that lead to a schedule that is too full.  Thinking about how you respond to certain situations. You may not be able to control everything, but you can control how you react.  Adding humor to your life by watching funny films or TV shows.  Making time for activities that help you relax and not feeling guilty about spending your time this way.  Medicines Your health care provider may suggest certain medicines if he or she feels that they will help improve your condition. Avoid using alcohol and other substances that may prevent your medicines from working properly (may interact). It is also important to:  Talk with your pharmacist or health care provider about all the medicines that you take, their possible side effects, and what medicines are safe to take together.  Make it your goal to take part in all treatment decisions (shared decision-making). This includes giving input on   the side effects of medicines. It is best if shared decision-making with your health care provider is part of your total treatment plan. If your health care provider prescribes a medicine, you may not notice the full benefits of it for 4-8 weeks. Most people who are treated for depression need to be on medicine for at least 6-12 months after they feel better. If you are taking  medicines as part of your treatment, do not stop taking medicines without first talking to your health care provider. You may need to have the medicine slowly decreased (tapered) over time to decrease the risk of harmful side effects. Relationships Your health care provider may suggest family therapy along with individual therapy and drug therapy. While there may not be family problems that are causing you to feel depressed, it is still important to make sure your family learns as much as they can about your mental health. Having your family's support can help make your treatment successful. How to recognize changes in your condition Everyone has a different response to treatment for depression. Recovery from major depression happens when you have not had signs of major depression for two months. This may mean that you will start to:  Have more interest in doing activities.  Feel less hopeless than you did 2 months ago.  Have more energy.  Overeat less often, or have better or improving appetite.  Have better concentration. Your health care provider will work with you to decide the next steps in your recovery. It is also important to recognize when your condition is getting worse. Watch for these signs:  Having fatigue or low energy.  Eating too much or too little.  Sleeping too much or too little.  Feeling restless, agitated, or hopeless.  Having trouble concentrating or making decisions.  Having unexplained physical complaints.  Feeling irritable, angry, or aggressive. Get help as soon as you or your family members notice these symptoms coming back. How to get support and help from others How to talk with friends and family members about your condition  Talking to friends and family members about your condition can provide you with one way to get support and guidance. Reach out to trusted friends or family members, explain your symptoms to them, and let them know that you are  working with a health care provider to treat your depression. Financial resources Not all insurance plans cover mental health care, so it is important to check with your insurance carrier. If paying for co-pays or counseling services is a problem, search for a local or county mental health care center. They may be able to offer public mental health care services at low or no cost when you are not able to see a private health care provider. If you are taking medicine for depression, you may be able to get the generic form, which may be less expensive. Some makers of prescription medicines also offer help to patients who cannot afford the medicines they need. Follow these instructions at home:   Get the right amount and quality of sleep.  Cut down on using caffeine, tobacco, alcohol, and other potentially harmful substances.  Try to exercise, such as walking or lifting small weights.  Take over-the-counter and prescription medicines only as told by your health care provider.  Eat a healthy diet that includes plenty of vegetables, fruits, whole grains, low-fat dairy products, and lean protein. Do not eat a lot of foods that are high in solid fats, added sugars, or salt.    Keep all follow-up visits as told by your health care provider. This is important. Contact a health care provider if:  You stop taking your antidepressant medicines, and you have any of these symptoms: ? Nausea. ? Headache. ? Feeling lightheaded. ? Chills and body aches. ? Not being able to sleep (insomnia).  You or your friends and family think your depression is getting worse. Get help right away if:  You have thoughts of hurting yourself or others. If you ever feel like you may hurt yourself or others, or have thoughts about taking your own life, get help right away. You can go to your nearest emergency department or call:  Your local emergency services (911 in the [place]  A suicide crisis helpline, such as the  National Suicide Prevention Lifeline at 1-800-273-8255. This is open 24-hours a day. Summary  If you are living with depression, there are ways to help you recover from it and also ways to prevent it from coming back.  Work with your health care team to create a management plan that includes counseling, stress management techniques, and healthy lifestyle habits. This information is not intended to replace advice given to you by your health care provider. Make sure you discuss any questions you have with your health care provider. Document Released: 05/07/2016 Document Revised: 09/26/2018 Document Reviewed: 05/07/2016 [employer] Patient Education  2020 [employer]  

## 2019-04-10 NOTE — Progress Notes (Signed)
BP 102/72 (BP Location: Left Arm, Patient Position: Sitting)    Pulse 86    Temp 98 F (36.7 C) (Oral)    Wt 165 lb (74.8 kg)    SpO2 98%    BMI 27.46 kg/m    Subjective:    Patient ID: Michael Woodward, male    DOB: 1991-03-15, 28 y.o.   MRN: [id number]  HPI: Michael Woodward is a 28 y.o. male  Chief Complaint  Patient presents with   Migraine    4 week f/up   MIGRAINES Started having them in high school.  Used to take Amitriptyline.  Stopped in 2014 and migraines have returned over the past year.  Started on Maxalt as needed last visit.  Has had to use twice only since last visit and reports headaches have improved.   Duration: years Onset: gradual Frequency: intermittent Location: frontal and into eyes Headache duration: Radiation: no Time of day headache occurs: varies Alleviating factors: nothing Aggravating factors: unknown Headache status at time of visit: asymptomatic Treatments attempted: Treatments attempted: amitriptyline   Aura: yes Nausea:  yes Vomiting: yes Photophobia:  yes Phonophobia:  yes Effect on social functioning:  yes Numbers of missed days of school/work each month: none Confusion:  no Gait disturbance/ataxia:  no Behavioral changes:  no Fevers:  no  HYPERTENSION Continues on Lisinopril 10 MG daily. Hypertension status: stable  Satisfied with current treatment? yes Duration of hypertension: chronic BP monitoring frequency:  not checking BP range: 120/88 in morning -- occasionally higher in evening BP medication side effects:  no Medication compliance: good compliance Aspirin: no Recurrent headaches: no Visual changes: no Palpitations: no Dyspnea: no Chest pain: no Lower extremity edema: no Dizzy/lightheaded: no   BIPOLAR DISORDER Has been on Viibryd 20 MG since January and this works well.  Denies racing thoughts or SI/HI.  Has used Trazodone before, but this was not beneficial.  Uses Clonidine as needed of panic attacks, tried Xanax but  did not like the way it made him feel.  Reports his Bipolar shifts from depression to periods of manic, but is well-controlled at this time on Viirbryd.  Is also taking Seroquel, but feels may need increase in dose as he is entering his "trigger" season and having trouble sleeping.  Lost his husband last winter and prior to this had many deaths in family around December. Mood status: stable Satisfied with current treatment?: yes Symptom severity: mild  Duration of current treatment : chronic Side effects: no Medication compliance: good compliance Psychotherapy/counseling:  none Previous psychiatric medications: multiple different medications Depressed mood: sometimes  Anxious mood: occasionally Anhedonia: no Significant weight loss or gain: no Insomnia: yes hard to fall asleep Fatigue: no Feelings of worthlessness or guilt: no Impaired concentration/indecisiveness: no Suicidal ideations: no Hopelessness: no Crying spells: no [facility] screen [facility] 2[facility]9 04/10/2019 01/26/2019  Decreased Interest 0 0  Down, Depressed, Hopeless 2 1  PHQ - 2 Score 2 1  Altered sleeping 2 2  Tired, decreased energy 1 2  Change in appetite 1 2  Feeling bad or failure about yourself  0 2  Trouble concentrating 2 1  Moving slowly or fidgety/restless 0 2  Suicidal thoughts 0 0  PHQ-9 Score 8 12   GAD 7 : Generalized Anxiety Score 04/10/2019 01/26/2019  Nervous, Anxious, on Edge 1 1  Control/stop worrying 1 1  Worry too much - different things 1 1  Trouble relaxing 1 2  Restless 0 2  Easily annoyed or irritable 0 1  Afraid - awful might happen 0 0  Total GAD 7 Score 4 8  Anxiety Difficulty Not difficult at all Somewhat difficult    Relevant past medical, surgical, family and social history reviewed and updated as indicated. Interim medical history since our last visit reviewed. Allergies and medications reviewed and updated.  Review of Systems  Constitutional: Negative for activity change,  diaphoresis, fatigue and fever.  Respiratory: Negative for cough, chest tightness, shortness of breath and wheezing.   Cardiovascular: Negative for chest pain, palpitations and leg swelling.  Gastrointestinal: Negative for abdominal distention, abdominal pain, constipation, diarrhea, nausea and vomiting.  Neurological: Negative for dizziness, syncope, weakness, light-headedness, numbness and headaches.  Psychiatric/Behavioral: Positive for decreased concentration and sleep disturbance. Negative for self-injury and suicidal ideas. The patient is not nervous/anxious.     Per HPI unless specifically indicated above     Objective:    BP 102/72 (BP Location: Left Arm, Patient Position: Sitting)    Pulse 86    Temp 98 F (36.7 C) (Oral)    Wt 165 lb (74.8 kg)    SpO2 98%    BMI 27.46 kg/m   Wt Readings from Last 3 Encounters:  04/10/19 165 lb (74.8 kg)  03/06/19 166 lb (75.3 kg)  01/26/19 171 lb (77.6 kg)    Physical Exam Vitals signs and nursing note reviewed.  Constitutional:      General: He is awake. He is not in acute distress.    Appearance: He is well-developed. He is not ill-appearing.  HENT:     Head: Normocephalic and atraumatic.     Right Ear: Hearing normal. No drainage.     Left Ear: Hearing normal. No drainage.  Eyes:     General: Lids are normal.        Right eye: No discharge.        Left eye: No discharge.     Conjunctiva/sclera: Conjunctivae normal.     Pupils: Pupils are equal, round, and reactive to light.  Neck:     Musculoskeletal: Normal range of motion and neck supple.     Thyroid: No thyromegaly.     Vascular: No carotid bruit.  Cardiovascular:     Rate and Rhythm: Normal rate and regular rhythm.     Heart sounds: Normal heart sounds, S1 normal and S2 normal. No murmur. No gallop.   Pulmonary:     Effort: Pulmonary effort is normal. No accessory muscle usage or respiratory distress.     Breath sounds: Normal breath sounds.  Abdominal:     General:  Bowel sounds are normal.     Palpations: Abdomen is soft.  Musculoskeletal: Normal range of motion.     Right lower leg: No edema.     Left lower leg: No edema.  Skin:    General: Skin is warm and dry.  Neurological:     Mental Status: He is alert and oriented to person, place, and time.  Psychiatric:        Mood and Affect: Mood normal.        Behavior: Behavior normal. Behavior is cooperative.        Thought Content: Thought content normal.        Judgment: Judgment normal.     Results for orders placed or performed in visit on 03/25/19  HM DIABETES EYE EXAM  Result Value Ref Range   HM Diabetic Eye Exam No Retinopathy No Retinopathy      Assessment & Plan:   Problem List Items  Addressed This Visit      Cardiovascular and Mediastinum   Essential hypertension    Ongoing, with BP on lower side.  Has had significant improvement with weight loss.  Will decrease Lisinopril to 2.5 MG and if ongoing stable BP during next visit will discontinue.  Continue to monitor BP at home and notify provider if any elevations or low readings.        Relevant Medications   lisinopril (ZESTRIL) 2.5 MG tablet   Migraines    Chronic, stable with improvement reported.  Minimal use of Maxalt, continue current regimen and adjust as needed.  Would avoid Amitriptyline due to pt being on multiple psych medications.        Relevant Medications   lisinopril (ZESTRIL) 2.5 MG tablet   lamoTRIgine (LAMICTAL) 200 MG tablet     Other   Bipolar disorder ([place]) - Primary    Ongoing, continue Viibryd and Lamictal. Increase Seroquel to 50 MG every night for sleep/mood.  Continue Clonidine as needed for panic.  Consider psychiatry referral if ongoing symptoms that are poorly controlled.  He denies SI/HI.          Follow up plan: Return in about 3 months (around 07/11/2019) for HTN, Mood, T2DM, Seizure.

## 2019-04-10 NOTE — Assessment & Plan Note (Signed)
Ongoing, with BP on lower side.  Has had significant improvement with weight loss.  Will decrease Lisinopril to 2.5 MG and if ongoing stable BP during next visit will discontinue.  Continue to monitor BP at home and notify provider if any elevations or low readings.

## 2019-04-10 NOTE — Assessment & Plan Note (Signed)
Ongoing, continue Viibryd and Lamictal. Increase Seroquel to 50 MG every night for sleep/mood.  Continue Clonidine as needed for panic.  Consider psychiatry referral if ongoing symptoms that are poorly controlled.  He denies SI/HI.

## 2019-06-15 ENCOUNTER — Ambulatory Visit: Payer: Self-pay | Admitting: *Deleted

## 2019-06-15 NOTE — Telephone Encounter (Signed)
Thank you.  Noted.  I appreciate your assistance.

## 2019-06-15 NOTE — Telephone Encounter (Signed)
The pt says that his BP was 70/60 in AM 06/16/2019, and 72/65 on 06/15/2019; his HR has been in the 50s-60s; the pt says that been low for the past 3 weeks; he states that he is very dizzy, and stays tired; the pt is out of town due to a family emergency; he says that he has lost a few more pounds for a total of 100 lbs this year; the pt says he has been taking Lisinopril for his BP; the pt would like to know if he can stop taking the medication; recommendations made per nurse triage protocol; the pt verbalized understanding, but says he is out town due to a family emergency, and his insurance will not cover an ED visit; he sees [place]; spoke with [place]; she requests for this encounter be sent to office for provider review, and they will call the pt back; he can be contacted at [phone].  Reason for Disposition . [9] Systolic BP < 90 AND [4] dizzy, lightheaded, or weak  Answer Assessment - Initial Assessment Questions 1. BLOOD PRESSURE: "What is the blood pressure?" "Did you take at least two measurements 5 minutes apart?"     70/60 and 72/65 2. ONSET: "When did you take your blood pressure?"     06/16/2019 and 06/15/2019 3. HOW: "How did you obtain the blood pressure?" (e.g., visiting nurse, automatic home BP monitor)   Home cuff; left upper and lower arms 4. HISTORY: "Do you have a history of low blood pressure?" "What is your blood pressure normally?"     no 5. MEDICATIONS: "Are you taking any medications for blood pressure?" If yes: "Have they been changed recently?"   yes 6. PULSE RATE: "Do you know what your pulse rate is?"    50s 7. OTHER SYMPTOMS: "Have you been sick recently?" "Have you had a recent injury?"     Dizziness, fatigue 8. PREGNANCY: "Is there any chance you are pregnant?" "When was your last menstrual period?"     no  Protocols used: LOW BLOOD PRESSURE-A-AH

## 2019-06-15 NOTE — Telephone Encounter (Signed)
Patient states that he will keep an eye on how he feels today and if he is not better tomorrow he will go to be evaluated.

## 2019-06-15 NOTE — Telephone Encounter (Signed)
Please advise patient to stop taking Lisinopril and increase hydration, lots of water.  Recommend ER visit if dizziness continues, would benefit from labs to ensure electrolytes are okay.  Could do urgent care too.

## 2019-07-09 ENCOUNTER — Ambulatory Visit: Payer: Self-pay | Admitting: *Deleted

## 2019-07-09 NOTE — Telephone Encounter (Signed)
Called and spoke with Room mate [place] ([place] reviewed). Junious Dresser stated  that she is with patient and he was still refusing go to the ER. She stated that patient said that if he has another seizure he will go to the ER, otherwise he will wait for his appt tomorrow. FYI

## 2019-07-09 NOTE — Telephone Encounter (Signed)
Patient is calling to report seizure today that was prolonged. Patient was under stress when it occurred. Patient states last seizure 4 months ago- but he believes he has been having them in his sleep. Patient does not want to go to ED - he has appointment tomorrow- but wants to know if he can be seen today. Call office to see if they will see him. They agree with protocol- ED- while on hold patient had another 10 minute seizure- advised room mate ED- patient is adamantly declining.  Reason for Disposition . 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 ONSET: "How long did the seizure last?" (Minutes)      Not sure- room mate found him- talked to room mate 23 minute ago and does not remember 2. CONTENT: "Describe what happened during the seizure. Did the body become stiff? Was there any jerking?"      Fell - not sure on back when found by roommate 3. CIRCUMSTANCE: "What was the individual doing when the seizure began?"      Patient was upset- thought someone was breaking in- thinks he is having seizure in sleep 4. MENTAL STATUS: "Does he know who he is, who you are, and where he is?"      alert 5. PRIOR SEIZURES: "Has the individual had a seizure (convulsion) before?" If so, ask: "When was the last time?" and "What happened last time?"     yes 6. EPILEPSY: "Does the individual have epilepsy?" (note: check for medical ID bracelet)     yes 7. MEDICATIONS: "Does the individual take anticonvulsant medications?" (e.g., yes/no, compliance, any recent changes)     Yes- Lamictal no missed doses 8. INJURY: "Did the individual hurt himself during the seizure?" (e.g., head, tongue)     Patient is sore- bruise on arm, head hurts 9. OTHER SYMPTOMS: "Are there any other symptoms?" (e.g., fever, headache)     headache 10. PREGNANCY: "Is there any chance you are pregnant?" "When was your last menstrual period?"  n/a  Protocols used: [facility]

## 2019-07-09 NOTE — Telephone Encounter (Signed)
Noted, thank you Synetta Fail.

## 2019-07-09 NOTE — Telephone Encounter (Signed)
Please alert room mate and him that his provider RECOMMENDS he go to ER immediately today.  This is concerning and needs to be assessed ASAP.

## 2019-07-10 ENCOUNTER — Ambulatory Visit: Payer: Medicaid Other | Admitting: Nurse Practitioner

## 2019-07-10 ENCOUNTER — Encounter: Payer: Self-pay | Admitting: Nurse Practitioner

## 2019-07-10 ENCOUNTER — Other Ambulatory Visit: Payer: Self-pay

## 2019-07-10 VITALS — BP 130/82 | HR 78 | Temp 98.3°F

## 2019-07-10 DIAGNOSIS — F3161 Bipolar disorder, current episode mixed, mild: Secondary | ICD-10-CM

## 2019-07-10 DIAGNOSIS — G40909 Epilepsy, unspecified, not intractable, without status epilepticus: Secondary | ICD-10-CM

## 2019-07-10 DIAGNOSIS — I10 Essential (primary) hypertension: Secondary | ICD-10-CM

## 2019-07-10 DIAGNOSIS — L659 Nonscarring hair loss, unspecified: Secondary | ICD-10-CM

## 2019-07-10 DIAGNOSIS — E119 Type 2 diabetes mellitus without complications: Secondary | ICD-10-CM | POA: Diagnosis not present

## 2019-07-10 MED ORDER — LEVETIRACETAM 250 MG PO TABS: 250.0000 mg | ORAL_TABLET | Freq: Two times a day (BID) | ORAL | 3 refills | Status: DC

## 2019-07-10 MED ORDER — ALPRAZOLAM 0.5 MG PO TABS: 0.5000 mg | ORAL_TABLET | ORAL | 0 refills | Status: DC | PRN

## 2019-07-10 NOTE — Assessment & Plan Note (Signed)
Ongoing, with BP below goal.  Has had significant improvement with weight loss.  Continue off medication.  Continue to monitor BP at home and notify provider if any elevations or low readings.  CMP today.

## 2019-07-10 NOTE — Patient Instructions (Signed)
Seizure, Adult °A seizure is a sudden burst of abnormal electrical activity in the brain. Seizures usually last from 30 seconds to 2 minutes. They can cause many different symptoms. °Usually, seizures are not harmful unless they last a long time. °What are the causes? °Common causes of this condition include: °· Fever or infection. °· Conditions that affect the brain, such as: °? A brain abnormality that you were born with. °? A brain or head injury. °? Bleeding in the brain. °? A tumor. °? Stroke. °? Brain disorders such as autism or cerebral palsy. °· Low blood sugar. °· Conditions that are passed from parent to child (are inherited). °· Problems with substances, such as: °? Having a reaction to a drug or a medicine. °? Suddenly stopping the use of a substance (withdrawal). °In some cases, the cause may not be known. A person who has repeated seizures over time without a clear cause has a condition called epilepsy. °What increases the risk? °You are more likely to get this condition if you have: °· A family history of epilepsy. °· Had a seizure in the past. °· A brain disorder. °· A history of head injury, lack of oxygen at birth, or strokes. °What are the signs or symptoms? °There are many types of seizures. The symptoms vary depending on the type of seizure you have. Examples of symptoms during a seizure include: °· Shaking (convulsions). °· Stiffness in the body. °· Passing out (losing consciousness). °· Head nodding. °· Staring. °· Not responding to sound or touch. °· Loss of bladder control and bowel control. °Some people have symptoms right before and right after a seizure happens. °Symptoms before a seizure may include: °· Fear. °· Worry (anxiety). °· Feeling like you may vomit (nauseous). °· Feeling like the room is spinning (vertigo). °· Feeling like you saw or heard something before (déjà vu). °· Odd tastes or smells. °· Changes in how you see. You may see flashing lights or spots. °Symptoms after a  seizure happens can include: °· Confusion. °· Sleepiness. °· Headache. °· Weakness on one side of the body. °How is this treated? °Most seizures will stop on their own in under 5 minutes. In these cases, no treatment is needed. Seizures that last longer than 5 minutes will usually need treatment. Treatment can include: °· Medicines given through an IV tube. °· Avoiding things that are known to cause your seizures. These can include medicines that you take for another condition. °· Medicines to treat epilepsy. °· Surgery to stop the seizures. This may be needed if medicines do not help. °Follow these instructions at home: °Medicines °· Take over-the-counter and prescription medicines only as told by your doctor. °· Do not eat or drink anything that may keep your medicine from working, such as alcohol. °Activity °· Do not do any activities that would be dangerous if you had another seizure, like driving or swimming. Wait until your doctor says it is safe for you to do them. °· If you live in the [place] ask your local DMV (department of motor vehicles) when you can drive. °· Get plenty of rest. °Teaching others °Teach friends and family what to do when you have a seizure. They should: °· Lay you on the ground. °· Protect your head and body. °· Loosen any tight clothing around your neck. °· Turn you on your side. °· Not hold you down. °· Not put anything into your mouth. °· Know whether or not you need emergency care. °· Stay   with you until you are better. ° °General instructions °· Contact your doctor each time you have a seizure. °· Avoid anything that gives you seizures. °· Keep a seizure diary. Write down: °? What you think caused each seizure. °? What you remember about each seizure. °· Keep all follow-up visits as told by your doctor. This is important. °Contact a doctor if: °· You have another seizure. °· You have seizures more often. °· There is any change in what happens during your seizures. °· You keep having  seizures with treatment. °· You have symptoms of being sick or having an infection. °Get help right away if: °· You have a seizure that: °? Lasts longer than 5 minutes. °? Is different than seizures you had before. °? Makes it harder to breathe. °? Happens after you hurt your head. °· You have any of these symptoms after a seizure: °? Not being able to speak. °? Not being able to use a part of your body. °? Confusion. °? A bad headache. °· You have two or more seizures in a row. °· You do not wake up right after a seizure. °· You get hurt during a seizure. °These symptoms may be an emergency. Do not wait to see if the symptoms will go away. Get medical help right away. Call your local emergency services (911 in the [place] Do not drive yourself to the hospital. °Summary °· Seizures usually last from 30 seconds to 2 minutes. Usually, they are not harmful unless they last a long time. °· Do not eat or drink anything that may keep your medicine from working, such as alcohol. °· Teach friends and family what to do when you have a seizure. °· Contact your doctor each time you have a seizure. °This information is not intended to replace advice given to you by your health care provider. Make sure you discuss any questions you have with your health care provider. °Document Revised: 08/22/2018 Document Reviewed: 08/22/2018 °[employer]vier Patient Education © 2020 [employer] ° °

## 2019-07-10 NOTE — Progress Notes (Signed)
BP 130/82   Pulse 78   Temp 98.3 F (36.8 C) (Oral)   SpO2 98%    Subjective:    Patient ID: Michael Woodward, male    DOB: 21-Mar-1991, 29 y.o.   MRN: [id number]  HPI: Michael Woodward is a 29 y.o. male  Chief Complaint  Patient presents with  . Anxiety  . Hypertension   BIPOLAR DISORDER Continues on Viibryd 20 MG, Seroquel 50 MG QHS, and Clonidine 0.1 MG (takes this at night as needed).  He recently found out his grandmother has an inoperable aneurysm and they have to start making plans for inevitable, she lives 5 hours away.  States major increased anxiety over this and is getting little sleep.  Requests something to help his nerves short term, does not want long term benzo and only wants a small dose.  He has taken Xanax in past with benefit.  He is also concerned as he continues to lose some weight, although now is not trying to and reports recent thinning of hair. Duration:uncontrolled Anxious mood: yes  Excessive worrying: yes Irritability: no  Sweating: no Nausea: no Palpitations:no Hyperventilation: no Panic attacks: yes Agoraphobia: no  Obscessions/compulsions: no Depressed mood: yes Depression screen [facility] 2/9 07/10/2019 04/10/2019 01/26/2019  Decreased Interest 3 0 0  Down, Depressed, Hopeless 3 2 1   PHQ - 2 Score 6 2 1   Altered sleeping 3 2 2   Tired, decreased energy 3 1 2   Change in appetite 3 1 2   Feeling bad or failure about yourself  3 0 2  Trouble concentrating 3 2 1   Moving slowly or fidgety/restless 3 0 2  Suicidal thoughts 0 0 0  PHQ-9 Score 24 8 12    Anhedonia: no Weight changes: yes, some loss Insomnia: yes hard to fall asleep  Hypersomnia: no Fatigue/loss of energy: yes Feelings of worthlessness: none Feelings of guilt: no Impaired concentration/indecisiveness: yes Suicidal ideations: no  Crying spells: yes Recent Stressors/Life Changes: yes   Relationship problems: no   Family stress: yes     Financial stress: no    Job stress: no    Recent  death/loss: no GAD 7 : Generalized Anxiety Score 07/10/2019 04/10/2019 01/26/2019  Nervous, Anxious, on Edge 3 1 1   Control/stop worrying 3 1 1   Worry too much - different things 3 1 1   Trouble relaxing 3 1 2   Restless 1 0 2  Easily annoyed or irritable 3 0 1  Afraid - awful might happen 1 0 0  Total GAD 7 Score 17 4 8   Anxiety Difficulty Very difficult Not difficult at all Somewhat difficult    HYPERTENSION No current medications.  Discontinued these months back due to stable BP.  History of diabetes diagnosis, but has lost weight and is no longer on medication for this. Hypertension status: stable  Satisfied with current treatment? 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  Does not recall if had concussion first and then had seizure or vice versa.  Was placed on multiple different types of seizure medication, has been on Lamictal for 5 years with good benefit.  Recent Lamictal level 8.3.  Started having seizures again recently, started yesterday with "big" ones.  Had two yesterday, tonic  clonic he reports.  States he was down on the ground, is sore today, and states did not injure anything but is generally sore and fatigued today.  His sister was present during the episodes.  He thinks he was having them last week in his sleep, but is not sure.  He endorses if he missed doses medication in past then he will have seizures, but has not missed doses recently.  States the Lamictal both benefits his Bipolar and seizures. Denies any loss of function, vision changes, injury, headaches, or slurred speech.  He was advised to go to ER yesterday, but refused stating  he knew they would just send him home "without doing anything since I was seeing you today".  He does agree to go to ER if any further or worsening.  No seizure activity today.  Denies alcohol or drug use, does vape regularly.  Relevant past medical, surgical, family and social history reviewed and updated as indicated. Interim medical history since our last visit reviewed. Allergies and medications reviewed and updated.  Review of Systems  Constitutional: Negative for activity change, diaphoresis, fatigue and fever.  Respiratory: Negative for cough, chest tightness, shortness of breath and wheezing.   Cardiovascular: Negative for chest pain, palpitations and leg swelling.  Gastrointestinal: Negative.   Endocrine: Negative for cold intolerance, heat intolerance, polydipsia, polyphagia and polyuria.  Neurological: Positive for seizures. Negative for dizziness, syncope, facial asymmetry, speech difficulty, weakness, light-headedness, numbness and headaches.  Psychiatric/Behavioral: Positive for decreased concentration and sleep disturbance. Negative for self-injury and suicidal ideas. The patient is nervous/anxious.     Per HPI unless specifically indicated above     Objective:    BP 130/82   Pulse 78   Temp 98.3 F (36.8 C) (Oral)   SpO2 98%   Wt Readings from Last 3 Encounters:  04/10/19 165 lb (74.8 kg)  03/06/19 166 lb (75.3 kg)  01/26/19 171 lb (77.6 kg)    Physical Exam Vitals and nursing note reviewed.  Constitutional:      General: He is awake. He is not in acute distress.    Appearance: He is well-developed and well-groomed. He is not ill-appearing.  HENT:     Head: Normocephalic and atraumatic.     Right Ear: Hearing normal. No drainage.     Left Ear: Hearing normal. No drainage.  Eyes:     General: Lids are normal.        Right eye: No discharge.        Left eye: No discharge.     Extraocular Movements: Extraocular movements intact.     Conjunctiva/sclera:  Conjunctivae normal.     Pupils: Pupils are equal, round, and reactive to light.     Visual Fields: Right eye visual fields normal and left eye visual fields normal.  Neck:     Thyroid: No thyromegaly.     Vascular: No carotid bruit.  Cardiovascular:     Rate and Rhythm: Normal rate and regular rhythm.     Heart sounds: Normal heart sounds, S1 normal and S2 normal. No murmur. No gallop.   Pulmonary:     Effort: Pulmonary effort is normal. No accessory muscle usage or respiratory distress.     Breath sounds: Normal breath sounds.  Abdominal:     General: Bowel sounds are normal.     Palpations: Abdomen is soft.  Musculoskeletal:        General: Normal range of motion.     Cervical back: Normal range of motion  and neck supple.     Right lower leg: No edema.     Left lower leg: No edema.  Skin:    General: Skin is warm and dry.  Neurological:     Mental Status: He is alert and oriented to person, place, and time.     Cranial Nerves: Cranial nerves are intact.     Motor: Motor function is intact.     Coordination: Coordination is intact.     Gait: Gait is intact.     Deep Tendon Reflexes: Reflexes are normal and symmetric.     Reflex Scores:      Brachioradialis reflexes are 2+ on the right side and 2+ on the left side.      Patellar reflexes are 2+ on the right side and 2+ on the left side. Psychiatric:        Attention and Perception: Attention normal.        Mood and Affect: Mood normal.        Speech: Speech normal.        Behavior: Behavior normal. Behavior is cooperative.        Thought Content: Thought content normal.        Judgment: Judgment normal.     Results for orders placed or performed in visit on 03/25/19  HM DIABETES EYE EXAM  Result Value Ref Range   HM Diabetic Eye Exam No Retinopathy No Retinopathy      Assessment & Plan:   Problem List Items Addressed This Visit      Cardiovascular and Mediastinum   Essential hypertension    Ongoing, with BP  below goal.  Has had significant improvement with weight loss.  Continue off medication.  Continue to monitor BP at home and notify provider if any elevations or low readings.  CMP today.      Relevant Orders   Thyroid Panel With TSH     Endocrine   Type 2 diabetes mellitus without complication, without long-term current use of insulin ([place])    Ongoing and stable with last A1C 5.0%. Continue off medication and focus on diet/exercise.   Recommend continue to monitor BS at home daily.  Praised for success.  Recheck A1C today.      Relevant Orders   HgB A1c     Nervous and Auditory   Seizure disorder ([place]) - Primary    Ongoing with recent increase in seizures.  He refused ER visit yesterday.  Suspect some increase related to stress, however due to frequency will add on Keppra 250 MG BID + continue Lamictal at current dose.  Obtain Lamictal level today.  Recommend he take this every day and use pill box as reminder. Neurology referral ordered and discussed with patient importance of establishing care with neuro.  Check TSH and CBC.  STRICT instructions to go to ER immediately if seizure activity present.      Relevant Medications   levETIRAcetam (KEPPRA) 250 MG tablet   Other Relevant Orders   Lamotrigine level   CBC with Differential/Platelet   Comprehensive metabolic panel     Other   Bipolar disorder ([place])    Ongoing, continue Viibryd and Seroquel + Lamictal.  Continue Clonidine as needed for panic.  Will write for short burst of Xanax 0.5 MG daily as needed for severe anxiety only, #20 pills with no refills sent.  Consider psychiatry referral if ongoing symptoms that are poorly controlled.  He denies SI/HI.       Other Visit Diagnoses  Thinning hair       Check thyroid panel today due to hair thinning, anxiety, and weight loss.       Follow up plan: Return in about 2 weeks (around 07/24/2019) for Seizure disorder.

## 2019-07-10 NOTE — Assessment & Plan Note (Addendum)
Ongoing with recent increase in seizures.  He refused ER visit yesterday.  Suspect some increase related to stress, however due to frequency will add on Keppra 250 MG BID + continue Lamictal at current dose.  Obtain Lamictal level today.  Recommend he take this every day and use pill box as reminder. Neurology referral ordered and discussed with patient importance of establishing care with neuro.  Check TSH and CBC.  STRICT instructions to go to ER immediately if seizure activity present.

## 2019-07-10 NOTE — Assessment & Plan Note (Signed)
Ongoing, continue Viibryd and Seroquel + Lamictal.  Continue Clonidine as needed for panic.  Will write for short burst of Xanax 0.5 MG daily as needed for severe anxiety only, #20 pills with no refills sent.  Consider psychiatry referral if ongoing symptoms that are poorly controlled.  He denies SI/HI.

## 2019-07-10 NOTE — Assessment & Plan Note (Addendum)
Ongoing and stable with last A1C 5.0%. Continue off medication and focus on diet/exercise.   Recommend continue to monitor BS at home daily.  Praised for success.  Recheck A1C today.

## 2019-07-12 ENCOUNTER — Encounter: Payer: Self-pay | Admitting: Nurse Practitioner

## 2019-07-12 NOTE — Progress Notes (Signed)
Contacted via MyChart

## 2019-07-14 LAB — CBC WITH DIFFERENTIAL/PLATELET
Basophils Absolute: 0 10*3/uL (ref 0.0–0.2)
Basos: 1 %
EOS (ABSOLUTE): 0.1 10*3/uL (ref 0.0–0.4)
Eos: 1 %
Hematocrit: 47.8 % (ref 37.5–51.0)
Hemoglobin: 16.3 g/dL (ref 13.0–17.7)
Immature Grans (Abs): 0 10*3/uL (ref 0.0–0.1)
Immature Granulocytes: 0 %
Lymphocytes Absolute: 2.5 10*3/uL (ref 0.7–3.1)
Lymphs: 36 %
MCH: 29.5 pg (ref 26.6–33.0)
MCHC: 34.1 g/dL (ref 31.5–35.7)
MCV: 86 fL (ref 79–97)
Monocytes Absolute: 0.5 10*3/uL (ref 0.1–0.9)
Monocytes: 8 %
Neutrophils Absolute: 3.8 10*3/uL (ref 1.4–7.0)
Neutrophils: 54 %
Platelets: 222 10*3/uL (ref 150–450)
RBC: 5.53 x10E6/uL (ref 4.14–5.80)
RDW: 12.4 % (ref 11.6–15.4)
WBC: 7 10*3/uL (ref 3.4–10.8)

## 2019-07-14 LAB — COMPREHENSIVE METABOLIC PANEL WITH GFR
ALT: 15 IU/L (ref 0–44)
AST: 19 IU/L (ref 0–40)
Albumin/Globulin Ratio: 2.1 (ref 1.2–2.2)
Albumin: 5.1 g/dL (ref 4.1–5.2)
Alkaline Phosphatase: 90 IU/L (ref 39–117)
BUN/Creatinine Ratio: 12 (ref 9–20)
BUN: 10 mg/dL (ref 6–20)
Bilirubin Total: 0.3 mg/dL (ref 0.0–1.2)
CO2: 25 mmol/L (ref 20–29)
Calcium: 10.5 mg/dL — ABNORMAL HIGH (ref 8.7–10.2)
Chloride: 102 mmol/L (ref 96–106)
Creatinine, Ser: 0.81 mg/dL (ref 0.76–1.27)
GFR calc Af Amer: 141 mL/min/1.73
GFR calc non Af Amer: 122 mL/min/1.73
Globulin, Total: 2.4 g/dL (ref 1.5–4.5)
Glucose: 82 mg/dL (ref 65–99)
Potassium: 4.3 mmol/L (ref 3.5–5.2)
Sodium: 140 mmol/L (ref 134–144)
Total Protein: 7.5 g/dL (ref 6.0–8.5)

## 2019-07-14 LAB — HEMOGLOBIN A1C
Est. average glucose Bld gHb Est-mCnc: 97 mg/dL
Hgb A1c MFr Bld: 5 % (ref 4.8–5.6)

## 2019-07-14 LAB — THYROID PANEL WITH TSH
Free Thyroxine Index: 1.8 (ref 1.2–4.9)
T3 Uptake Ratio: 31 % (ref 24–39)
T4, Total: 5.7 ug/dL (ref 4.5–12.0)
TSH: 1.19 u[IU]/mL (ref 0.450–4.500)

## 2019-07-14 LAB — LAMOTRIGINE LEVEL: Lamotrigine Lvl: 4.8 ug/mL (ref 2.0–20.0)

## 2019-07-14 NOTE — Progress Notes (Signed)
Contacted via MyChart

## 2019-07-24 ENCOUNTER — Ambulatory Visit (INDEPENDENT_AMBULATORY_CARE_PROVIDER_SITE_OTHER): Payer: Medicaid Other | Admitting: Nurse Practitioner

## 2019-07-24 ENCOUNTER — Encounter: Payer: Self-pay | Admitting: Nurse Practitioner

## 2019-07-24 ENCOUNTER — Other Ambulatory Visit: Payer: Self-pay

## 2019-07-24 DIAGNOSIS — G40909 Epilepsy, unspecified, not intractable, without status epilepticus: Secondary | ICD-10-CM

## 2019-07-24 NOTE — Patient Instructions (Signed)
Seizure, Adult °A seizure is a sudden burst of abnormal electrical activity in the brain. Seizures usually last from 30 seconds to 2 minutes. They can cause many different symptoms. °Usually, seizures are not harmful unless they last a long time. °What are the causes? °Common causes of this condition include: °· Fever or infection. °· Conditions that affect the brain, such as: °? A brain abnormality that you were born with. °? A brain or head injury. °? Bleeding in the brain. °? A tumor. °? Stroke. °? Brain disorders such as autism or cerebral palsy. °· Low blood sugar. °· Conditions that are passed from parent to child (are inherited). °· Problems with substances, such as: °? Having a reaction to a drug or a medicine. °? Suddenly stopping the use of a substance (withdrawal). °In some cases, the cause may not be known. A person who has repeated seizures over time without a clear cause has a condition called epilepsy. °What increases the risk? °You are more likely to get this condition if you have: °· A family history of epilepsy. °· Had a seizure in the past. °· A brain disorder. °· A history of head injury, lack of oxygen at birth, or strokes. °What are the signs or symptoms? °There are many types of seizures. The symptoms vary depending on the type of seizure you have. Examples of symptoms during a seizure include: °· Shaking (convulsions). °· Stiffness in the body. °· Passing out (losing consciousness). °· Head nodding. °· Staring. °· Not responding to sound or touch. °· Loss of bladder control and bowel control. °Some people have symptoms right before and right after a seizure happens. °Symptoms before a seizure may include: °· Fear. °· Worry (anxiety). °· Feeling like you may vomit (nauseous). °· Feeling like the room is spinning (vertigo). °· Feeling like you saw or heard something before (déjà vu). °· Odd tastes or smells. °· Changes in how you see. You may see flashing lights or spots. °Symptoms after a  seizure happens can include: °· Confusion. °· Sleepiness. °· Headache. °· Weakness on one side of the body. °How is this treated? °Most seizures will stop on their own in under 5 minutes. In these cases, no treatment is needed. Seizures that last longer than 5 minutes will usually need treatment. Treatment can include: °· Medicines given through an IV tube. °· Avoiding things that are known to cause your seizures. These can include medicines that you take for another condition. °· Medicines to treat epilepsy. °· Surgery to stop the seizures. This may be needed if medicines do not help. °Follow these instructions at home: °Medicines °· Take over-the-counter and prescription medicines only as told by your doctor. °· Do not eat or drink anything that may keep your medicine from working, such as alcohol. °Activity °· Do not do any activities that would be dangerous if you had another seizure, like driving or swimming. Wait until your doctor says it is safe for you to do them. °· If you live in the [place] ask your local DMV (department of motor vehicles) when you can drive. °· Get plenty of rest. °Teaching others °Teach friends and family what to do when you have a seizure. They should: °· Lay you on the ground. °· Protect your head and body. °· Loosen any tight clothing around your neck. °· Turn you on your side. °· Not hold you down. °· Not put anything into your mouth. °· Know whether or not you need emergency care. °· Stay   with you until you are better. ° °General instructions °· Contact your doctor each time you have a seizure. °· Avoid anything that gives you seizures. °· Keep a seizure diary. Write down: °? What you think caused each seizure. °? What you remember about each seizure. °· Keep all follow-up visits as told by your doctor. This is important. °Contact a doctor if: °· You have another seizure. °· You have seizures more often. °· There is any change in what happens during your seizures. °· You keep having  seizures with treatment. °· You have symptoms of being sick or having an infection. °Get help right away if: °· You have a seizure that: °? Lasts longer than 5 minutes. °? Is different than seizures you had before. °? Makes it harder to breathe. °? Happens after you hurt your head. °· You have any of these symptoms after a seizure: °? Not being able to speak. °? Not being able to use a part of your body. °? Confusion. °? A bad headache. °· You have two or more seizures in a row. °· You do not wake up right after a seizure. °· You get hurt during a seizure. °These symptoms may be an emergency. Do not wait to see if the symptoms will go away. Get medical help right away. Call your local emergency services (911 in the [place] Do not drive yourself to the hospital. °Summary °· Seizures usually last from 30 seconds to 2 minutes. Usually, they are not harmful unless they last a long time. °· Do not eat or drink anything that may keep your medicine from working, such as alcohol. °· Teach friends and family what to do when you have a seizure. °· Contact your doctor each time you have a seizure. °This information is not intended to replace advice given to you by your health care provider. Make sure you discuss any questions you have with your health care provider. °Document Revised: 08/22/2018 Document Reviewed: 08/22/2018 °[employer]vier Patient Education © 2020 [employer] ° °

## 2019-07-24 NOTE — Progress Notes (Signed)
BP 120/73   Pulse 86   Temp 98.1 F (36.7 C) (Oral)   SpO2 96%    Subjective:    Patient ID: Michael Woodward, male    DOB: 06-14-1991, 29 y.o.   MRN: [id number]  HPI: Michael Woodward is a 29 y.o. male  Chief Complaint  Patient presents with  . 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 Does not recall if had concussion first and then had seizure or vice versa. Was placed on multiple different types of seizure medication, has been on Lamictal for 5 years with good benefit. Recent Lamictal level 4.8.  Started on Keppra 250 MG BID at recent visit due to increased seizures.  No further seizures since starting Keppra 2 weeks ago.  He has also been using hemp, vaping this.  Has mellowed him and overall made him feel better.   Had seizure two weeks ago, tonic clonic he reports.  States he was down on the ground and did not injure anything.  His sister was present during the episodes.  He thinks he was having them that week in his sleep, but is not sure. He endorses if he missed doses medication in past then he would have seizures, but has not missed doses recently. States the Lamictal both benefits his Bipolar and seizures. Is scheduled to see neurology on February 18th. No seizure activity today.  Denies alcohol or drug use, does vape regularly.    Relevant past medical, surgical, family and social history reviewed and updated as indicated. Interim medical history since our last visit reviewed. Allergies and medications reviewed and updated.  Review of Systems  Constitutional: Negative for activity change, diaphoresis, fatigue and fever.  Respiratory: Negative for cough, chest tightness, shortness of breath and wheezing.   Cardiovascular: Negative for chest pain, palpitations and leg swelling.  Neurological: Negative for dizziness, seizures, syncope, weakness and headaches.  Psychiatric/Behavioral: Negative.     Per HPI unless specifically  indicated above     Objective:    BP 120/73   Pulse 86   Temp 98.1 F (36.7 C) (Oral)   SpO2 96%   Wt Readings from Last 3 Encounters:  04/10/19 165 lb (74.8 kg)  03/06/19 166 lb (75.3 kg)  01/26/19 171 lb (77.6 kg)    Physical Exam Vitals and nursing note reviewed.  Constitutional:      General: He is awake. He is not in acute distress.    Appearance: He is well-developed and well-groomed. He is not ill-appearing.  HENT:     Head: Normocephalic and atraumatic.     Right Ear: Hearing normal. No drainage.     Left Ear: Hearing normal. No drainage.  Eyes:     General: Lids are normal.        Right eye: No discharge.        Left eye: No discharge.     Extraocular Movements: Extraocular movements intact.     Conjunctiva/sclera: Conjunctivae normal.     Pupils: Pupils are equal, round, and reactive to light.     Visual Fields: Right eye visual fields normal and left eye visual fields normal.  Neck:     Thyroid: No thyromegaly.     Vascular: No carotid bruit.  Cardiovascular:     Rate and Rhythm: Normal rate and regular rhythm.     Heart sounds: Normal heart sounds, S1 normal and S2 normal. No murmur. No gallop.   Pulmonary:  Effort: Pulmonary effort is normal. No accessory muscle usage or respiratory distress.     Breath sounds: Normal breath sounds.  Abdominal:     General: Bowel sounds are normal.     Palpations: Abdomen is soft.  Musculoskeletal:        General: Normal range of motion.     Cervical back: Normal range of motion and neck supple.     Right lower leg: No edema.     Left lower leg: No edema.  Skin:    General: Skin is warm and dry.  Neurological:     Mental Status: He is alert and oriented to person, place, and time.     Cranial Nerves: Cranial nerves are intact.     Motor: Motor function is intact.     Coordination: Coordination is intact.     Gait: Gait is intact.     Deep Tendon Reflexes: Reflexes are normal and symmetric.     Reflex Scores:       Brachioradialis reflexes are 2+ on the right side and 2+ on the left side.      Patellar reflexes are 2+ on the right side and 2+ on the left side. Psychiatric:        Attention and Perception: Attention normal.        Mood and Affect: Mood normal.        Speech: Speech normal.        Behavior: Behavior normal. Behavior is cooperative.        Thought Content: Thought content normal.        Judgment: Judgment normal.     Results for orders placed or performed in visit on 07/10/19  Lamotrigine level  Result Value Ref Range   Lamotrigine Lvl 4.8 2.0 - 20.0 ug/mL  CBC with Differential/Platelet  Result Value Ref Range   WBC 7.0 3.4 - 10.8 x10E3/uL   RBC 5.53 4.14 - 5.80 x10E6/uL   Hemoglobin 16.3 13.0 - 17.7 g/dL   Hematocrit 60.1 09.3 - 51.0 %   MCV 86 79 - 97 fL   MCH 29.5 26.6 - 33.0 pg   MCHC 34.1 31.5 - 35.7 g/dL   RDW 23.5 57.3 - 22.0 %   Platelets 222 150 - 450 x10E3/uL   Neutrophils 54 Not Estab. %   Lymphs 36 Not Estab. %   Monocytes 8 Not Estab. %   Eos 1 Not Estab. %   Basos 1 Not Estab. %   Neutrophils Absolute 3.8 1.4 - 7.0 x10E3/uL   Lymphocytes Absolute 2.5 0.7 - 3.1 x10E3/uL   Monocytes Absolute 0.5 0.1 - 0.9 x10E3/uL   EOS (ABSOLUTE) 0.1 0.0 - 0.4 x10E3/uL   Basophils Absolute 0.0 0.0 - 0.2 x10E3/uL   Immature Granulocytes 0 Not Estab. %   Immature Grans (Abs) 0.0 0.0 - 0.1 x10E3/uL  Comprehensive metabolic panel  Result Value Ref Range   Glucose 82 65 - 99 mg/dL   BUN 10 6 - 20 mg/dL   Creatinine, Ser 2.54 0.76 - 1.27 mg/dL   GFR calc non Af Amer 122 >59 mL/min/1.73   GFR calc Af Amer 141 >59 mL/min/1.73   BUN/Creatinine Ratio 12 9 - 20   Sodium 140 134 - 144 mmol/L   Potassium 4.3 3.5 - 5.2 mmol/L   Chloride 102 96 - 106 mmol/L   CO2 25 20 - 29 mmol/L   Calcium 10.5 (H) 8.7 - 10.2 mg/dL   Total Protein 7.5 6.0 - 8.5 g/dL   Albumin  5.1 4.1 - 5.2 g/dL   Globulin, Total 2.4 1.5 - 4.5 g/dL   Albumin/Globulin Ratio 2.1 1.2 - 2.2   Bilirubin Total  0.3 0.0 - 1.2 mg/dL   Alkaline Phosphatase 90 39 - 117 IU/L   AST 19 0 - 40 IU/L   ALT 15 0 - 44 IU/L  HgB A1c  Result Value Ref Range   Hgb A1c MFr Bld 5.0 4.8 - 5.6 %   Est. average glucose Bld gHb Est-mCnc 97 mg/dL  Thyroid Panel With TSH  Result Value Ref Range   TSH 1.190 0.450 - 4.500 uIU/mL   T4, Total 5.7 4.5 - 12.0 ug/dL   T3 Uptake Ratio 31 24 - 39 %   Free Thyroxine Index 1.8 1.2 - 4.9      Assessment & Plan:   Problem List Items Addressed This Visit      Nervous and Auditory   Seizure disorder (HCC)    Chronic with no further seizure activity with addition of Keppra.  Will continue Lamictal and Keppra at this time, check Keppra level next visit.  Scheduled to see neurology upcoming, will benefit to maintain this collaboration with patients history.  Return to office at end of March for follow-up, sooner if worsening seizures.          Follow up plan: Return in about 8 weeks (around 09/18/2019) for Seizure, T2DM, HTN.

## 2019-07-24 NOTE — Assessment & Plan Note (Signed)
Chronic with no further seizure activity with addition of Keppra.  Will continue Lamictal and Keppra at this time, check Keppra level next visit.  Scheduled to see neurology upcoming, will benefit to maintain this collaboration with patients history.  Return to office at end of March for follow-up, sooner if worsening seizures.

## 2019-07-28 ENCOUNTER — Other Ambulatory Visit: Payer: Self-pay | Admitting: Nurse Practitioner

## 2019-08-03 ENCOUNTER — Ambulatory Visit (INDEPENDENT_AMBULATORY_CARE_PROVIDER_SITE_OTHER)
Admission: RE | Admit: 2019-08-03 | Discharge: 2019-08-03 | Disposition: A | Discharge status: Home / Self Care | Payer: Medicaid Other | Source: Ambulatory Visit

## 2019-08-03 ENCOUNTER — Telehealth: Payer: Self-pay | Admitting: Family

## 2019-08-03 DIAGNOSIS — M25471 Effusion, right ankle: Secondary | ICD-10-CM | POA: Diagnosis not present

## 2019-08-03 DIAGNOSIS — R112 Nausea with vomiting, unspecified: Secondary | ICD-10-CM

## 2019-08-03 DIAGNOSIS — M25571 Pain in right ankle and joints of right foot: Secondary | ICD-10-CM | POA: Diagnosis not present

## 2019-08-03 DIAGNOSIS — M25579 Pain in unspecified ankle and joints of unspecified foot: Secondary | ICD-10-CM

## 2019-08-03 MED ORDER — ONDANSETRON 4 MG PO TBDP: 4.0000 mg | ORAL_TABLET | Freq: Three times a day (TID) | ORAL | 0 refills | Status: DC | PRN

## 2019-08-03 MED ORDER — PREDNISONE 20 MG PO TABS: 20.0000 mg | ORAL_TABLET | Freq: Every day | ORAL | 0 refills | Status: AC

## 2019-08-03 NOTE — Progress Notes (Signed)
Based on what you shared with me, I feel your condition warrants further evaluation and I recommend that you be seen for a face to face office visit.  Given your ankle pain that is making you nauseous you need to be seen face to face to rule out a fracture.    NOTE: If you entered your credit card information for this eVisit, you will not be charged. You may see a "hold" on your card for the $35 but that hold will drop off and you will not have a charge processed.   If you are having a true medical emergency please call 911.      For an urgent face to face visit, [facility] has five urgent care centers for your convenience:      NEW:  [facility] at [facility] Directions [phone] [address] [place], [place] [address] . 10 am - 6pm Monday - Friday    [facility]) Get Driving Directions [phone] [address] [place], [place] [address] . 10 am to 8 pm Monday-Friday . 12 pm to 8 pm [facility] Urgent Care at [facility] Get Driving Directions [phone] [address] [place], [place] [address] . 8 am to 8 pm Monday-Friday . 9 am to 6 pm Saturday . 11 am to 6 pm Sunday     [facility] at [facility] Get Driving Directions  [phone] [address] [place], [place] [address] . 8 am to 8 pm Monday-Friday . 8 am to 4 pm [facility] Urgent Care at [facility] Directions [phone] [address] [place], [place] [address] . 12 pm to 6 pm Monday-Friday      Your e-visit answers were reviewed by a board certified advanced clinical practitioner to complete your personal care plan.  Thank you for using e-Visits.

## 2019-08-03 NOTE — Discharge Instructions (Signed)
Prednisone 30 mg daily for 5 days Continue ice and elevate Follow up in person if not improving

## 2019-08-03 NOTE — ED Provider Notes (Signed)
Virtual Visit via Video Note:  Michael Woodward  initiated request for Telemedicine visit with [facility] Urgent Care team. I connected with Michael Woodward  on 08/03/2019 at 7:05 PM  for a synchronized telemedicine visit using a video enabled HIPPA compliant telemedicine application. I verified that I am speaking with Michael Woodward  using two identifiers. Idaly Verret C Emree Locicero, PA-C  was physically located in a [facility] Urgent care site and Maclin Guerrette was located at a different location.   The limitations of evaluation and management by telemedicine as well as the availability of in-person appointments were discussed. Patient was informed that he  may incur a bill ( including co-pay) for this virtual visit encounter. Michael Woodward  expressed understanding and gave verbal consent to proceed with virtual visit.     History of Present Illness:Michael Woodward  is a 29 y.o. male presents for evaluation of right ankle pain.  Patient states that over the past few days he has had increased pain and swelling in his right ankle.  Denies any new injury or fall.  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  He believes this has been flared up by the recent cold weather rain and ice.  He has been using Aleve and ibuprofen without relief.  He denies any significant overlying redness.  Denies fevers.   Allergies  Allergen Reactions  . Phenergan [Promethazine Hcl]     Cause seizures      Past Medical History:  Diagnosis Date  . Allergy   . Anxiety   . Asthma   . Depression   . Diabetes mellitus without complication ([place])   . Seizures ([place])      Social History   Tobacco Use  . Smoking status: Former Smoker    Packs/day: 2.00    Types: Cigarettes  . Smokeless tobacco: Never Used  Substance Use Topics  . Alcohol use: Not Currently  . Drug use: Not Currently        Observations/Objective: Physical Exam  Constitutional: He is  oriented to person, place, and time and well-developed, well-nourished, and in no distress. No distress.  HENT:  Head: Normocephalic and atraumatic.  Pulmonary/Chest: Effort normal. No respiratory distress.  Speaking in full sentences  Musculoskeletal:     Cervical back: Normal range of motion.     Comments: Right ankle: Appears mildly swollen to lateral malleolus, patient points to lateral malleolus extending into fifth metatarsal and across dorsum of proximal foot and to lower medial malleolus; no significant erythema noted, full active range of motion  Neurological: He is alert and oriented to person, place, and time.  Speech clear, face symmetric     Assessment and Plan:    ICD-10-CM   1. Pain and swelling of right ankle  M25.571    M25.471      Ankle pain without new injury.  Most likely inflammatory.  Has been using NSAIDs without relief, will do trial of prednisone as alternative.  Continue ice and elevation.  Zofran as needed for nausea per patient request.  No sign of cellulitis or infection at this time.  Continue to monitor.  Discussed strict return precautions. Patient verbalized understanding and is agreeable with plan.   Follow Up Instructions:     I discussed the assessment and treatment plan with the patient. The patient was provided an opportunity to ask questions and all were answered. The patient agreed with the plan and demonstrated  an understanding of the instructions.   The patient was advised to call back or seek an in-person evaluation if the symptoms worsen or if the condition fails to improve as anticipated.      Lew Dawes, PA-C  08/03/2019 7:05 PM         Lew Dawes, PA-C 08/03/19 1905

## 2019-08-06 ENCOUNTER — Encounter: Payer: Self-pay | Admitting: Nurse Practitioner

## 2019-09-03 ENCOUNTER — Telehealth: Payer: Self-pay | Admitting: Nurse Practitioner

## 2019-09-03 ENCOUNTER — Other Ambulatory Visit: Payer: Self-pay | Admitting: Nurse Practitioner

## 2019-09-03 MED ORDER — CLONIDINE HCL 0.1 MG PO TABS: 0.1000 mg | ORAL_TABLET | Freq: Two times a day (BID) | ORAL | 0 refills | Status: DC | PRN

## 2019-09-03 MED ORDER — LEVETIRACETAM 250 MG PO TABS: 250.0000 mg | ORAL_TABLET | Freq: Two times a day (BID) | ORAL | 0 refills | Status: DC

## 2019-09-03 MED ORDER — GABAPENTIN 400 MG PO CAPS: 400.0000 mg | ORAL_CAPSULE | Freq: Three times a day (TID) | ORAL | 0 refills | Status: DC

## 2019-09-03 MED ORDER — VIIBRYD 20 MG PO TABS: 20.0000 mg | ORAL_TABLET | Freq: Every day | ORAL | 0 refills | Status: DC

## 2019-09-03 MED ORDER — LAMOTRIGINE 200 MG PO TABS: 200.0000 mg | ORAL_TABLET | Freq: Two times a day (BID) | ORAL | 0 refills | Status: DC

## 2019-09-03 MED ORDER — QUETIAPINE FUMARATE 50 MG PO TABS: 50.0000 mg | ORAL_TABLET | Freq: Every day | ORAL | 0 refills | Status: DC

## 2019-09-03 NOTE — Telephone Encounter (Signed)
Called and spoke with patient, he had to leave due to his mothering being in the hospital, and having to deal with his grandmother since his mother was the sole care taker.   He did not take any of his medications with him, he would like to know if he can get a month supply sent to  [facility]  [address] [place], [place] [address]

## 2019-09-03 NOTE — Telephone Encounter (Signed)
Have sent a 30 day supply of all maintenance medications to [facility].  If any acute medication needs would recommend visit to urgent care there.

## 2019-09-03 NOTE — Telephone Encounter (Signed)
Copied from CRM [id number]. Topic: General - Other >> Sep 03, 2019 11:39 AM Tamela Oddi wrote: Reason for CRM: Patient requests a call from the nurse regarding her medications due to a family emergency.  She will need all meds to be sent to a different location and would like to speak with the nurse about it.  CB# [phone]

## 2019-09-03 NOTE — Telephone Encounter (Signed)
Patient notified

## 2019-09-03 NOTE — Telephone Encounter (Signed)
Pt stated he had to get off the phone to talk to mother's doctor and did not finish conversation with Tiffany. Would like a callback regarding rx's. [phone]

## 2019-10-02 ENCOUNTER — Ambulatory Visit: Payer: Medicaid Other | Admitting: Nurse Practitioner

## 2019-10-02 ENCOUNTER — Telehealth: Payer: Self-pay | Admitting: Nurse Practitioner

## 2019-10-02 NOTE — Telephone Encounter (Signed)
Copied from CRM [id number]. Topic: General - Inquiry >> Oct 02, 2019  1:16 PM Leary Roca wrote: Reason for CRM: Patient wanted to update dr Harvest Dark on a recent rip to the hospital. Wanted to speak with Dr Candis Schatz nurse. Please advise

## 2019-10-02 NOTE — Telephone Encounter (Signed)
Called pt, lvm for pt to call back to schedule appt.

## 2019-10-02 NOTE — Telephone Encounter (Signed)
Pt called back in to follow up on call back. Pt says that he was air lifted due to having several seizures. Pt would like to discuss further with provider. Pt says that his levels seem to be off track and would like to know what to do?    Please assist.

## 2019-10-05 NOTE — Telephone Encounter (Signed)
He is being followed by neurology, Dr. Malvin Johns, I would recommend he see neurology ASAP.  Per recent note they wanted to do imaging and labs, I do not believe these have been done yet.  He can also schedule with me, as per recent neurology note they would like a psychiatry referral for him.

## 2019-10-05 NOTE — Telephone Encounter (Signed)
LVM for pt to call back and scheduled an appt.

## 2019-10-09 NOTE — Telephone Encounter (Signed)
Pt called back to schedule ER FU. Pt states that he is 5 hours away and cannot come in to the office. He is requesting a virtual visit, tried calling office. Please advise

## 2019-10-09 NOTE — Telephone Encounter (Signed)
Noted, thank you

## 2019-10-09 NOTE — Telephone Encounter (Signed)
Unable to contact pt. Forwarding to close gap.

## 2019-10-09 NOTE — Telephone Encounter (Signed)
App scheduled.

## 2019-10-14 ENCOUNTER — Telehealth (INDEPENDENT_AMBULATORY_CARE_PROVIDER_SITE_OTHER): Payer: Medicaid Other | Admitting: Nurse Practitioner

## 2019-10-14 ENCOUNTER — Encounter: Payer: Self-pay | Admitting: Nurse Practitioner

## 2019-10-14 ENCOUNTER — Telehealth: Payer: Self-pay | Admitting: Nurse Practitioner

## 2019-10-14 VITALS — BP 162/102 | HR 83 | Wt 162.0 lb

## 2019-10-14 DIAGNOSIS — J3489 Other specified disorders of nose and nasal sinuses: Secondary | ICD-10-CM | POA: Diagnosis not present

## 2019-10-14 DIAGNOSIS — F3161 Bipolar disorder, current episode mixed, mild: Secondary | ICD-10-CM | POA: Diagnosis not present

## 2019-10-14 DIAGNOSIS — G40909 Epilepsy, unspecified, not intractable, without status epilepticus: Secondary | ICD-10-CM

## 2019-10-14 DIAGNOSIS — I10 Essential (primary) hypertension: Secondary | ICD-10-CM | POA: Diagnosis not present

## 2019-10-14 MED ORDER — LISINOPRIL 5 MG PO TABS: 5.0000 mg | ORAL_TABLET | Freq: Every day | ORAL | 0 refills | Status: DC

## 2019-10-14 MED ORDER — AMOXICILLIN-POT CLAVULANATE 875-125 MG PO TABS: 1.0000 | ORAL_TABLET | Freq: Two times a day (BID) | ORAL | 0 refills | Status: AC

## 2019-10-14 MED ORDER — ALPRAZOLAM 0.5 MG PO TABS: 0.5000 mg | ORAL_TABLET | ORAL | 0 refills | Status: DC | PRN

## 2019-10-14 NOTE — Assessment & Plan Note (Signed)
Ongoing, with BP elevated recently due to stressors.  Will restart Lisinopril  5 MG at this time, script sent.  Continue to monitor BP at home and notify provider if any elevations or low readings.  Return to office in 4 weeks.

## 2019-10-14 NOTE — Assessment & Plan Note (Signed)
Ongoing, recent visit with neurology and they discontinued Keppra.  Continue Lamictal at current dose.  Recommend he take this every day and use pill box as reminder.  Continue to work on relaxation methods to reduce stress.  STRICT instructions to go to ER immediately if seizure activity present.  Continue collaboration with neurology. 

## 2019-10-14 NOTE — Telephone Encounter (Signed)
lvm to make this apt.  

## 2019-10-14 NOTE — Assessment & Plan Note (Signed)
Ongoing for weeks.  Recommend Covid testing and quarantine until symptoms improve.  At this time due to ongoing symptoms and length of time, will send in Augmentin to treat.  Recommend continued use of Mucinex for cough as needed and OTC Claritin.  Increase hydration and rest.  For worsening or ongoing symptoms return to office.

## 2019-10-14 NOTE — Telephone Encounter (Signed)
-----   Message from Marjie Skiff, NP sent at 10/14/2019  1:37 PM EDT ----- 4 week follow-up need

## 2019-10-14 NOTE — Assessment & Plan Note (Signed)
Ongoing, continue Viibryd and Seroquel + Lamictal.  Continue Clonidine as needed for panic.  Will continue short burst of Xanax 0.5 MG daily as needed for severe anxiety only, #20 pills with no refills sent.  Is aware this is short period use only and not continuous.  Referral to psychiatry placed.  He denies SI/HI.

## 2019-10-14 NOTE — Progress Notes (Signed)
BP (!) 162/102   Pulse 83   Wt 162 lb (73.5 kg)   BMI 26.96 kg/m    Subjective:    Patient ID: Michael Woodward, male    DOB: 07-Jul-1990, 29 y.o.   MRN: [id number]  HPI: Michael Woodward is a 28 y.o. male  Chief Complaint  Patient presents with  . Anxiety  . Depression    . This visit was completed via MyChart due to the restrictions of the COVID-19 pandemic. All issues as above were discussed and addressed. Physical exam was done as above through visual confirmation on MyChart. If it was felt that the patient should be evaluated in the office, they were directed there. The patient verbally consented to this visit. . Location of the patient: home . Location of the provider: work . Those involved with this call:  . Provider: Aura Dials, DNP . CMA: Wilhemena Durie, CMA . Front Desk/Registration: Adela Ports  . Time spent on call: 15 minutes with patient face to face via video conference. More than 50% of this time was spent in counseling and coordination of care. 10 minutes total spent in review of patient's record and preparation of their chart.  . I verified patient identity using two factors (patient name and date of birth). Patient consents verbally to being seen via telemedicine visit today.    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 Does not recall if had concussion first and then had seizure or vice versa. Was placed on multiple different types of seizure medication, has been on Lamictal for5years with good benefit. Recent Lamictal level 4.8.  Started on Keppra 250 MG BID at last PCP visit due to increased seizures, but this was discontinued by neurology due to side effects.  He has also been using hemp, vaping this.  Saw neurology, Dr. Malvin Johns, on 09/07/19.  He is to continue Lamotrigine 200 MG BID and MRI + labs ordered (telemedicine visit), they also placed referral to psychiatry.  Had recent grand mal, went to [facility] in [place] -- reports  his LFT were elevated (ALT 77, AST 44) and he had infection (WBC 11.6) -- per his report he reviewed paperwork with me that he was discharged with -- was unable to pull him up via [facility] Everywhere.  States he did not see a doctor and was discharged home.  He continues to be up in [facility] caring for his mother and grandmother.  He endorses if he missed doses medication in pastthen he would have seizures, but has not missed doses recently. States the Lamictal both benefits his Bipolar and seizures. Denies alcohol or drug use, does vape regularly.    HYPERTENSION Was previously on medication, but BP improved and was taken off.  BP is now elevated again due to his recent increased stressors.  Previously took Lisinopril. Hypertension status: stable  Satisfied with current treatment? yes Duration of hypertension: chronic BP monitoring frequency:  daily BP range: 150-160/80-100 BP medication side effects:  no Medication compliance: good compliance Aspirin: no Recurrent headaches: no Visual changes: no Palpitations: no Dyspnea: no Chest pain: no Lower extremity edema: no Dizzy/lightheaded: no   BIPOLAR DISORDER Has been on Viibryd 20 MG since January and this works well. Denies racing thoughts or SI/HI. Has used Trazodone before, but this was not beneficial. Uses Clonidine as needed for panic attacks, had some Xanax prescribed recently due to increased stressors with his mother being sick and under palliative service -- increased stressors, this was  filled last 07/10/19 for 20 tabs, no refills.  He reports Xanax has been helping and only uses for major anxiety.  He is his mom's power of attorney at this time.  His grandmother he is also caring for as is in last stages of COPD and dementia.  Reports his Bipolar shifts from depression to periods of manic, but is well-controlled at this time on Viirbryd.  Is also taking Seroquel, which benefits sleep.   Has seen psychiatry in past and  agrees with returning.  Currently is seeing therapy. Duration:stable Anxious mood: yes  Excessive worrying: yes Irritability: no  Sweating: no Nausea: no Palpitations:no Hyperventilation: no Panic attacks: yes Agoraphobia: no  Obscessions/compulsions: no Depressed mood: yes Depression screen [facility] 2/9 10/14/2019 07/10/2019 04/10/2019 01/26/2019  Decreased Interest 3 3 0 0  Down, Depressed, Hopeless 3 3 2 1   PHQ - 2 Score 6 6 2 1   Altered sleeping 1 3 2 2   Tired, decreased energy 3 3 1 2   Change in appetite 2 3 1 2   Feeling bad or failure about yourself  3 3 0 2  Trouble concentrating 3 3 2 1   Moving slowly or fidgety/restless 0 3 0 2  Suicidal thoughts 0 0 0 0  PHQ-9 Score 18 24 8 12   Difficult doing work/chores Very difficult - - -   Anhedonia: no Weight changes: no Insomnia: yes hard to fall asleep  Hypersomnia: no Fatigue/loss of energy: no Feelings of worthlessness: yes Feelings of guilt: yes Impaired concentration/indecisiveness: yes Suicidal ideations: no  Crying spells: yes Recent Stressors/Life Changes: yes   Relationship problems: yes   Family stress: yes     Financial stress: no    Job stress: no    Recent death/loss: no GAD 7 : Generalized Anxiety Score 10/14/2019 07/10/2019 04/10/2019 01/26/2019  Nervous, Anxious, on Edge 3 3 1 1   Control/stop worrying 3 3 1 1   Worry too much - different things 3 3 1 1   Trouble relaxing 3 3 1 2   Restless 3 1 0 2  Easily annoyed or irritable 3 3 0 1  Afraid - awful might happen 0 1 0 0  Total GAD 7 Score 18 17 4 8   Anxiety Difficulty Very difficult Very difficult Not difficult at all Somewhat difficult    UPPER RESPIRATORY TRACT INFECTION Reports sinus headaches started one month ago, but over past 1 1/2 weeks started coughing up phlegm and nasal drainage + increased sinus pressure/pain.  Denies loss of taste or smell Fever: no Cough: yes Shortness of breath: no Wheezing: no Chest pain: no Chest tightness: no Chest  congestion: no Nasal congestion: yes Runny nose: no Post nasal drip: yes Sneezing: no Sore throat: no Swollen glands: no Sinus pressure: yes Headache: yes Face pain: yes Toothache: no Ear pain: none Ear pressure: none Eyes red/itching:no Eye drainage/crusting: no  Vomiting: no Rash: no Fatigue: yes Sick contacts: no Strep contacts: no  Context: fluctuating Recurrent sinusitis: no Relief with OTC cold/cough medications: yes  Treatments attempted: mucinex   Relevant past medical, surgical, family and social history reviewed and updated as indicated. Interim medical history since our last visit reviewed. Allergies and medications reviewed and updated.  Review of Systems  Constitutional: Negative for activity change, chills, diaphoresis, fatigue and fever.  HENT: Positive for congestion, postnasal drip, sinus pressure and sinus pain. Negative for ear discharge, ear pain, rhinorrhea, sneezing and sore throat.   Respiratory: Positive for cough. Negative for chest tightness, shortness of breath and wheezing.  Cardiovascular: Negative for chest pain, palpitations and leg swelling.  Gastrointestinal: Negative.   Neurological: Positive for seizures. Negative for dizziness, syncope, weakness, light-headedness, numbness and headaches.  Psychiatric/Behavioral: Negative.     Per HPI unless specifically indicated above     Objective:    BP (!) 162/102   Pulse 83   Wt 162 lb (73.5 kg)   BMI 26.96 kg/m   Wt Readings from Last 3 Encounters:  10/14/19 162 lb (73.5 kg)  04/10/19 165 lb (74.8 kg)  03/06/19 166 lb (75.3 kg)    Physical Exam Vitals and nursing note reviewed.  Constitutional:      General: He is awake. He is not in acute distress.    Appearance: He is well-developed. He is not ill-appearing.  HENT:     Head: Normocephalic.     Right Ear: Hearing normal. No drainage.     Left Ear: Hearing normal. No drainage.  Eyes:     General: Lids are normal.        Right  eye: No discharge.        Left eye: No discharge.     Conjunctiva/sclera: Conjunctivae normal.  Pulmonary:     Effort: Pulmonary effort is normal. No accessory muscle usage or respiratory distress.  Musculoskeletal:     Cervical back: Normal range of motion.  Neurological:     Mental Status: He is alert and oriented to person, place, and time.  Psychiatric:        Mood and Affect: Mood normal.        Behavior: Behavior normal. Behavior is cooperative.        Thought Content: Thought content normal.        Judgment: Judgment normal.     Results for orders placed or performed in visit on 07/10/19  Lamotrigine level  Result Value Ref Range   Lamotrigine Lvl 4.8 2.0 - 20.0 ug/mL  CBC with Differential/Platelet  Result Value Ref Range   WBC 7.0 3.4 - 10.8 x10E3/uL   RBC 5.53 4.14 - 5.80 x10E6/uL   Hemoglobin 16.3 13.0 - 17.7 g/dL   Hematocrit 23.5 57.3 - 51.0 %   MCV 86 79 - 97 fL   MCH 29.5 26.6 - 33.0 pg   MCHC 34.1 31.5 - 35.7 g/dL   RDW 22.0 25.4 - 27.0 %   Platelets 222 150 - 450 x10E3/uL   Neutrophils 54 Not Estab. %   Lymphs 36 Not Estab. %   Monocytes 8 Not Estab. %   Eos 1 Not Estab. %   Basos 1 Not Estab. %   Neutrophils Absolute 3.8 1.4 - 7.0 x10E3/uL   Lymphocytes Absolute 2.5 0.7 - 3.1 x10E3/uL   Monocytes Absolute 0.5 0.1 - 0.9 x10E3/uL   EOS (ABSOLUTE) 0.1 0.0 - 0.4 x10E3/uL   Basophils Absolute 0.0 0.0 - 0.2 x10E3/uL   Immature Granulocytes 0 Not Estab. %   Immature Grans (Abs) 0.0 0.0 - 0.1 x10E3/uL  Comprehensive metabolic panel  Result Value Ref Range   Glucose 82 65 - 99 mg/dL   BUN 10 6 - 20 mg/dL   Creatinine, Ser 6.23 0.76 - 1.27 mg/dL   GFR calc non Af Amer 122 >59 mL/min/1.73   GFR calc Af Amer 141 >59 mL/min/1.73   BUN/Creatinine Ratio 12 9 - 20   Sodium 140 134 - 144 mmol/L   Potassium 4.3 3.5 - 5.2 mmol/L   Chloride 102 96 - 106 mmol/L   CO2 25 20 - 29 mmol/L  Calcium 10.5 (H) 8.7 - 10.2 mg/dL   Total Protein 7.5 6.0 - 8.5 g/dL    Albumin 5.1 4.1 - 5.2 g/dL   Globulin, Total 2.4 1.5 - 4.5 g/dL   Albumin/Globulin Ratio 2.1 1.2 - 2.2   Bilirubin Total 0.3 0.0 - 1.2 mg/dL   Alkaline Phosphatase 90 39 - 117 IU/L   AST 19 0 - 40 IU/L   ALT 15 0 - 44 IU/L  HgB A1c  Result Value Ref Range   Hgb A1c MFr Bld 5.0 4.8 - 5.6 %   Est. average glucose Bld gHb Est-mCnc 97 mg/dL  Thyroid Panel With TSH  Result Value Ref Range   TSH 1.190 0.450 - 4.500 uIU/mL   T4, Total 5.7 4.5 - 12.0 ug/dL   T3 Uptake Ratio 31 24 - 39 %   Free Thyroxine Index 1.8 1.2 - 4.9      Assessment & Plan:   Problem List Items Addressed This Visit      Cardiovascular and Mediastinum   Essential hypertension    Ongoing, with BP elevated recently due to stressors.  Will restart Lisinopril  5 MG at this time, script sent.  Continue to monitor BP at home and notify provider if any elevations or low readings.  Return to office in 4 weeks.      Relevant Medications   lisinopril (ZESTRIL) 5 MG tablet     Nervous and Auditory   Seizure disorder (HCC) - Primary    Ongoing, recent visit with neurology and they discontinued Keppra.  Continue Lamictal at current dose.  Recommend he take this every day and use pill box as reminder.  Continue to work on relaxation methods to reduce stress.  STRICT instructions to go to ER immediately if seizure activity present.  Continue collaboration with neurology.        Other   Bipolar disorder (HCC)    Ongoing, continue Viibryd and Seroquel + Lamictal.  Continue Clonidine as needed for panic.  Will continue short burst of Xanax 0.5 MG daily as needed for severe anxiety only, #20 pills with no refills sent.  Is aware this is short period use only and not continuous.  Referral to psychiatry placed.  He denies SI/HI.      Relevant Orders   Ambulatory referral to Psychiatry   Sinus pain    Ongoing for weeks.  Recommend Covid testing and quarantine until symptoms improve.  At this time due to ongoing symptoms and  length of time, will send in Augmentin to treat.  Recommend continued use of Mucinex for cough as needed and OTC Claritin.  Increase hydration and rest.  For worsening or ongoing symptoms return to office.         I discussed the assessment and treatment plan with the patient. The patient was provided an opportunity to ask questions and all were answered. The patient agreed with the plan and demonstrated an understanding of the instructions.   The patient was advised to call back or seek an in-person evaluation if the symptoms worsen or if the condition fails to improve as anticipated.   I provided 15+ minutes of time during this encounter.  Follow up plan: Return in about 4 weeks (around 11/11/2019) for HTN, Seizures, Mood.

## 2019-10-14 NOTE — Patient Instructions (Signed)

## 2019-11-12 ENCOUNTER — Telehealth (INDEPENDENT_AMBULATORY_CARE_PROVIDER_SITE_OTHER): Payer: Medicaid Other | Admitting: Nurse Practitioner

## 2019-11-12 ENCOUNTER — Encounter: Payer: Self-pay | Admitting: Nurse Practitioner

## 2019-11-12 VITALS — BP 136/90 | HR 88 | Wt 157.8 lb

## 2019-11-12 DIAGNOSIS — F3161 Bipolar disorder, current episode mixed, mild: Secondary | ICD-10-CM | POA: Diagnosis not present

## 2019-11-12 DIAGNOSIS — I10 Essential (primary) hypertension: Secondary | ICD-10-CM | POA: Diagnosis not present

## 2019-11-12 DIAGNOSIS — G40909 Epilepsy, unspecified, not intractable, without status epilepticus: Secondary | ICD-10-CM

## 2019-11-12 NOTE — Assessment & Plan Note (Signed)
Ongoing, continue Viibryd and Seroquel + Lamictal.  Continue Clonidine as needed for panic.  Will continue short burst of Xanax 0.5 MG daily as needed for severe anxiety only, #20 pills with no refills sent last visit.  Is aware this is short period use only and not continuous.  Referral to psychiatry placed last visit and recommend he schedule with them on return to town.  He denies SI/HI.

## 2019-11-12 NOTE — Progress Notes (Signed)
BP 136/90   Pulse 88   Wt 157 lb 12.8 oz (71.6 kg)   SpO2 99%   BMI 26.26 kg/m    Subjective:    Patient ID: Michael Woodward, male    DOB: 05-Jan-1991, 29 y.o.   MRN: [id number]  HPI: Michael Woodward is a 29 y.o. male  Chief Complaint  Patient presents with  . Depression  . Hypertension    . This visit was completed via MyChart due to the restrictions of the COVID-19 pandemic. All issues as above were discussed and addressed. Physical exam was done as above through visual confirmation on MyChart. If it was felt that the patient should be evaluated in the office, they were directed there. The patient verbally consented to this visit. . Location of the patient: home . Location of the provider: home . Those involved with this call:  . Provider: Aura Dials, DNP . CMA: Wilhemena Durie, CMA . Front Desk/Registration: Adela Ports  . Time spent on call: 15 minutes with patient face to face via video conference. More than 50% of this time was spent in counseling and coordination of care. 10 minutes total spent in review of patient's record and preparation of their chart.  . I verified patient identity using two factors (patient name and date of birth). Patient consents verbally to being seen via telemedicine visit today.    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 Does not recall if had concussion first and then had seizure or vice versa. Was placed on multiple different types of seizure medication, has been on Lamictal for5years with good benefit. Recent Lamictal level4.8.He has also been using hemp, vaping this. Saw neurology, Dr. Malvin Johns, on 09/07/19.  He is to continue Lamotrigine 200 MG BID and MRI + labs ordered (telemedicine visit). His last grand mal, per his report, was about one week ago due to him missing a dose of medication.  He continues to be up in [facility] caring for his mother and grandmother, but is returning to [place] in upcoming  week.  He endorsesifhe missed doses medication in pastthen he wouldhave seizures, but has not missed doses recently. States the Lamictal both benefits his Bipolar and seizures. Denies alcohol or drug use, does vape regularly.  HYPERTENSION Was previously on medication, but BP improved and was taken off. At recent visit on 10/14/19 his Lisinopril was restarted at 5 MG due to BP elevations with recent stressors as he cares for family members. Hypertension status: stable  Satisfied with current treatment? yes Duration of hypertension: chronic BP monitoring frequency:  daily BP range: 130/80's on average BP medication side effects:  no Medication compliance: good compliance Aspirin: no Recurrent headaches: no Visual changes: no Palpitations: no Dyspnea: no Chest pain: no Lower extremity edema: no Dizzy/lightheaded: no   BIPOLAR DISORDER Has been on Viibryd 20 MG since January and this works well. Denies racing thoughts or SI/HI. Has used Trazodone before, but this was not beneficial.Uses Clonidine as needed for panic attacks, had some Xanax prescribed recently due to increased stressors with his mother being sick and under palliative service -- increased stressors, this was filled last 10/14/19 for 20 tabs, no refills.  He reports Xanax has been helping and only uses for major anxiety, is aware this is only short term treatment.  He is his mom's power of attorney at this time.His grandmother he is also caring for as is in last stages of COPD and dementia.  Reports his Bipolar shifts from  depression to periods of manic, but is well-controlled at this time on Viirbryd.Is also taking Seroquel, which benefits sleep.   Has seen psychiatry in past and agrees with returning.  A referral was placed to [facility] on 10/14/19 and on review patient did not schedule or answer calls to schedule appointment with them.  Is doing online therapy. States he has been having some recent increase in his  IBD symptoms due to anxiety, but is taking probiotics for this.   Duration:stable Anxious mood: yes  Excessive worrying: yes Irritability: no  Sweating: no Nausea: no Palpitations:no Hyperventilation: no Panic attacks: yes Agoraphobia: no  Obscessions/compulsions: no Depressed mood: yes  Relevant past medical, surgical, family and social history reviewed and updated as indicated. Interim medical history since our last visit reviewed. Allergies and medications reviewed and updated.  Review of Systems  Constitutional: Negative for activity change, chills, diaphoresis, fatigue and fever.  Respiratory: Negative for cough, chest tightness, shortness of breath and wheezing.   Cardiovascular: Negative for chest pain, palpitations and leg swelling.  Gastrointestinal: Negative.   Neurological: Positive for seizures. Negative for dizziness, syncope, weakness, light-headedness, numbness and headaches.  Psychiatric/Behavioral: Negative.     Per HPI unless specifically indicated above     Objective:    BP 136/90   Pulse 88   Wt 157 lb 12.8 oz (71.6 kg)   SpO2 99%   BMI 26.26 kg/m   Wt Readings from Last 3 Encounters:  11/12/19 157 lb 12.8 oz (71.6 kg)  10/14/19 162 lb (73.5 kg)  04/10/19 165 lb (74.8 kg)    Physical Exam Vitals and nursing note reviewed.  Constitutional:      General: He is awake. He is not in acute distress.    Appearance: He is well-developed. He is not ill-appearing.  HENT:     Head: Normocephalic.     Right Ear: Hearing normal. No drainage.     Left Ear: Hearing normal. No drainage.  Eyes:     General: Lids are normal.        Right eye: No discharge.        Left eye: No discharge.     Conjunctiva/sclera: Conjunctivae normal.  Pulmonary:     Effort: Pulmonary effort is normal. No accessory muscle usage or respiratory distress.  Musculoskeletal:     Cervical back: Normal range of motion.  Neurological:     Mental Status: He is alert and oriented to  person, place, and time.  Psychiatric:        Mood and Affect: Mood normal.        Behavior: Behavior normal. Behavior is cooperative.        Thought Content: Thought content normal.        Judgment: Judgment normal.     Results for orders placed or performed in visit on 07/10/19  Lamotrigine level  Result Value Ref Range   Lamotrigine Lvl 4.8 2.0 - 20.0 ug/mL  CBC with Differential/Platelet  Result Value Ref Range   WBC 7.0 3.4 - 10.8 x10E3/uL   RBC 5.53 4.14 - 5.80 x10E6/uL   Hemoglobin 16.3 13.0 - 17.7 g/dL   Hematocrit 16.0 73.7 - 51.0 %   MCV 86 79 - 97 fL   MCH 29.5 26.6 - 33.0 pg   MCHC 34.1 31.5 - 35.7 g/dL   RDW 10.6 26.9 - 48.5 %   Platelets 222 150 - 450 x10E3/uL   Neutrophils 54 Not Estab. %   Lymphs 36 Not Estab. %  Monocytes 8 Not Estab. %   Eos 1 Not Estab. %   Basos 1 Not Estab. %   Neutrophils Absolute 3.8 1.4 - 7.0 x10E3/uL   Lymphocytes Absolute 2.5 0.7 - 3.1 x10E3/uL   Monocytes Absolute 0.5 0.1 - 0.9 x10E3/uL   EOS (ABSOLUTE) 0.1 0.0 - 0.4 x10E3/uL   Basophils Absolute 0.0 0.0 - 0.2 x10E3/uL   Immature Granulocytes 0 Not Estab. %   Immature Grans (Abs) 0.0 0.0 - 0.1 x10E3/uL  Comprehensive metabolic panel  Result Value Ref Range   Glucose 82 65 - 99 mg/dL   BUN 10 6 - 20 mg/dL   Creatinine, Ser 0.81 0.76 - 1.27 mg/dL   GFR calc non Af Amer 122 >59 mL/min/1.73   GFR calc Af Amer 141 >59 mL/min/1.73   BUN/Creatinine Ratio 12 9 - 20   Sodium 140 134 - 144 mmol/L   Potassium 4.3 3.5 - 5.2 mmol/L   Chloride 102 96 - 106 mmol/L   CO2 25 20 - 29 mmol/L   Calcium 10.5 (H) 8.7 - 10.2 mg/dL   Total Protein 7.5 6.0 - 8.5 g/dL   Albumin 5.1 4.1 - 5.2 g/dL   Globulin, Total 2.4 1.5 - 4.5 g/dL   Albumin/Globulin Ratio 2.1 1.2 - 2.2   Bilirubin Total 0.3 0.0 - 1.2 mg/dL   Alkaline Phosphatase 90 39 - 117 IU/L   AST 19 0 - 40 IU/L   ALT 15 0 - 44 IU/L  HgB A1c  Result Value Ref Range   Hgb A1c MFr Bld 5.0 4.8 - 5.6 %   Est. average glucose Bld gHb  Est-mCnc 97 mg/dL  Thyroid Panel With TSH  Result Value Ref Range   TSH 1.190 0.450 - 4.500 uIU/mL   T4, Total 5.7 4.5 - 12.0 ug/dL   T3 Uptake Ratio 31 24 - 39 %   Free Thyroxine Index 1.8 1.2 - 4.9      Assessment & Plan:   Problem List Items Addressed This Visit      Cardiovascular and Mediastinum   Essential hypertension    Ongoing, with improvement with addition of low dose Lisinopril.  Continue current medication regimen and adjust as needed.  Continue to monitor BP at home and notify provider if any elevations or low readings.  Return to office in upcoming weeks, once returns to [place].  Need labs and in office visit.        Nervous and Auditory   Seizure disorder ([place]) - Primary    Ongoing, recent visit with neurology and they discontinued Keppra.  Continue Lamictal at current dose.  Recommend he take this every day and use pill box as reminder.  Continue to work on relaxation methods to reduce stress.  STRICT instructions to go to ER immediately if seizure activity present.  Continue collaboration with neurology.        Other   Bipolar disorder ([employer])    Ongoing, continue Viibryd and Seroquel + Lamictal.  Continue Clonidine as needed for panic.  Will continue short burst of Xanax 0.5 MG daily as needed for severe anxiety only, #20 pills with no refills sent last visit.  Is aware this is short period use only and not continuous.  Referral to psychiatry placed last visit and recommend he schedule with them on return to town.  He denies SI/HI.         I discussed the assessment and treatment plan with the patient. The patient was provided an opportunity to ask questions  and all were answered. The patient agreed with the plan and demonstrated an understanding of the instructions.   The patient was advised to call back or seek an in-person evaluation if the symptoms worsen or if the condition fails to improve as anticipated.   I provided 15+ minutes of time during this  encounter.  Follow up plan: Return in about 4 weeks (around 12/10/2019) for Seizures, Mood, and HTN + IBD.

## 2019-11-12 NOTE — Patient Instructions (Signed)
Seizure, Adult °A seizure is a sudden burst of abnormal electrical activity in the brain. Seizures usually last from 30 seconds to 2 minutes. They can cause many different symptoms. °Usually, seizures are not harmful unless they last a long time. °What are the causes? °Common causes of this condition include: °· Fever or infection. °· Conditions that affect the brain, such as: °? A brain abnormality that you were born with. °? A brain or head injury. °? Bleeding in the brain. °? A tumor. °? Stroke. °? Brain disorders such as autism or cerebral palsy. °· Low blood sugar. °· Conditions that are passed from parent to child (are inherited). °· Problems with substances, such as: °? Having a reaction to a drug or a medicine. °? Suddenly stopping the use of a substance (withdrawal). °In some cases, the cause may not be known. A person who has repeated seizures over time without a clear cause has a condition called epilepsy. °What increases the risk? °You are more likely to get this condition if you have: °· A family history of epilepsy. °· Had a seizure in the past. °· A brain disorder. °· A history of head injury, lack of oxygen at birth, or strokes. °What are the signs or symptoms? °There are many types of seizures. The symptoms vary depending on the type of seizure you have. Examples of symptoms during a seizure include: °· Shaking (convulsions). °· Stiffness in the body. °· Passing out (losing consciousness). °· Head nodding. °· Staring. °· Not responding to sound or touch. °· Loss of bladder control and bowel control. °Some people have symptoms right before and right after a seizure happens. °Symptoms before a seizure may include: °· Fear. °· Worry (anxiety). °· Feeling like you may vomit (nauseous). °· Feeling like the room is spinning (vertigo). °· Feeling like you saw or heard something before (déjà vu). °· Odd tastes or smells. °· Changes in how you see. You may see flashing lights or spots. °Symptoms after a  seizure happens can include: °· Confusion. °· Sleepiness. °· Headache. °· Weakness on one side of the body. °How is this treated? °Most seizures will stop on their own in under 5 minutes. In these cases, no treatment is needed. Seizures that last longer than 5 minutes will usually need treatment. Treatment can include: °· Medicines given through an IV tube. °· Avoiding things that are known to cause your seizures. These can include medicines that you take for another condition. °· Medicines to treat epilepsy. °· Surgery to stop the seizures. This may be needed if medicines do not help. °Follow these instructions at home: °Medicines °· Take over-the-counter and prescription medicines only as told by your doctor. °· Do not eat or drink anything that may keep your medicine from working, such as alcohol. °Activity °· Do not do any activities that would be dangerous if you had another seizure, like driving or swimming. Wait until your doctor says it is safe for you to do them. °· If you live in the [place] ask your local DMV (department of motor vehicles) when you can drive. °· Get plenty of rest. °Teaching others °Teach friends and family what to do when you have a seizure. They should: °· Lay you on the ground. °· Protect your head and body. °· Loosen any tight clothing around your neck. °· Turn you on your side. °· Not hold you down. °· Not put anything into your mouth. °· Know whether or not you need emergency care. °· Stay   with you until you are better. ° °General instructions °· Contact your doctor each time you have a seizure. °· Avoid anything that gives you seizures. °· Keep a seizure diary. Write down: °? What you think caused each seizure. °? What you remember about each seizure. °· Keep all follow-up visits as told by your doctor. This is important. °Contact a doctor if: °· You have another seizure. °· You have seizures more often. °· There is any change in what happens during your seizures. °· You keep having  seizures with treatment. °· You have symptoms of being sick or having an infection. °Get help right away if: °· You have a seizure that: °? Lasts longer than 5 minutes. °? Is different than seizures you had before. °? Makes it harder to breathe. °? Happens after you hurt your head. °· You have any of these symptoms after a seizure: °? Not being able to speak. °? Not being able to use a part of your body. °? Confusion. °? A bad headache. °· You have two or more seizures in a row. °· You do not wake up right after a seizure. °· You get hurt during a seizure. °These symptoms may be an emergency. Do not wait to see if the symptoms will go away. Get medical help right away. Call your local emergency services (911 in the [place] Do not drive yourself to the hospital. °Summary °· Seizures usually last from 30 seconds to 2 minutes. Usually, they are not harmful unless they last a long time. °· Do not eat or drink anything that may keep your medicine from working, such as alcohol. °· Teach friends and family what to do when you have a seizure. °· Contact your doctor each time you have a seizure. °This information is not intended to replace advice given to you by your health care provider. Make sure you discuss any questions you have with your health care provider. °Document Revised: 08/22/2018 Document Reviewed: 08/22/2018 °[employer]vier Patient Education © 2020 [employer] ° °

## 2019-11-12 NOTE — Assessment & Plan Note (Signed)
Ongoing, with improvement with addition of low dose Lisinopril.  Continue current medication regimen and adjust as needed.  Continue to monitor BP at home and notify provider if any elevations or low readings.  Return to office in upcoming weeks, once returns to [place].  Need labs and in office visit.

## 2019-11-12 NOTE — Assessment & Plan Note (Signed)
Ongoing, recent visit with neurology and they discontinued Keppra.  Continue Lamictal at current dose.  Recommend he take this every day and use pill box as reminder.  Continue to work on relaxation methods to reduce stress.  STRICT instructions to go to ER immediately if seizure activity present.  Continue collaboration with neurology.

## 2019-11-15 ENCOUNTER — Emergency Department
Admission: EM | Admit: 2019-11-15 | Discharge: 2019-11-15 | Disposition: A | Discharge status: Home / Self Care | Payer: Medicaid Other | Attending: Emergency Medicine | Admitting: Emergency Medicine

## 2019-11-15 ENCOUNTER — Encounter: Payer: Self-pay | Admitting: Emergency Medicine

## 2019-11-15 ENCOUNTER — Emergency Department: Payer: Medicaid Other

## 2019-11-15 DIAGNOSIS — E119 Type 2 diabetes mellitus without complications: Secondary | ICD-10-CM | POA: Diagnosis not present

## 2019-11-15 DIAGNOSIS — Z79899 Other long term (current) drug therapy: Secondary | ICD-10-CM | POA: Diagnosis not present

## 2019-11-15 DIAGNOSIS — R1084 Generalized abdominal pain: Secondary | ICD-10-CM | POA: Diagnosis not present

## 2019-11-15 DIAGNOSIS — I10 Essential (primary) hypertension: Secondary | ICD-10-CM | POA: Insufficient documentation

## 2019-11-15 DIAGNOSIS — F17210 Nicotine dependence, cigarettes, uncomplicated: Secondary | ICD-10-CM | POA: Insufficient documentation

## 2019-11-15 HISTORY — DX: Helicobacter pylori (H. pylori) as the cause of diseases classified elsewhere: B96.81

## 2019-11-15 HISTORY — DX: Essential (primary) hypertension: I10

## 2019-11-15 LAB — URINALYSIS, COMPLETE (UACMP) WITH MICROSCOPIC
Bacteria, UA: NONE SEEN
Bilirubin Urine: NEGATIVE
Glucose, UA: NEGATIVE mg/dL
Hgb urine dipstick: NEGATIVE
Ketones, ur: 80 mg/dL — AB
Leukocytes,Ua: NEGATIVE
Nitrite: NEGATIVE
Protein, ur: NEGATIVE mg/dL
Specific Gravity, Urine: 1.036 — ABNORMAL HIGH (ref 1.005–1.030)
Squamous Epithelial / HPF: NONE SEEN (ref 0–5)
pH: 6 (ref 5.0–8.0)

## 2019-11-15 LAB — COMPREHENSIVE METABOLIC PANEL
ALT: 16 U/L (ref 0–44)
AST: 20 U/L (ref 15–41)
Albumin: 4.3 g/dL (ref 3.5–5.0)
Alkaline Phosphatase: 61 U/L (ref 38–126)
Anion gap: 9 (ref 5–15)
BUN: 14 mg/dL (ref 6–20)
CO2: 23 mmol/L (ref 22–32)
Calcium: 8.9 mg/dL (ref 8.9–10.3)
Chloride: 108 mmol/L (ref 98–111)
Creatinine, Ser: 0.88 mg/dL (ref 0.61–1.24)
GFR calc Af Amer: >60 mL/min (ref >60)
GFR calc non Af Amer: >60 mL/min (ref >60)
Glucose, Bld: 82 mg/dL (ref 70–99)
Potassium: 3.9 mmol/L (ref 3.5–5.1)
Sodium: 140 mmol/L (ref 135–145)
Total Bilirubin: 0.7 mg/dL (ref 0.3–1.2)
Total Protein: 7.2 g/dL (ref 6.5–8.1)

## 2019-11-15 LAB — LIPASE, BLOOD: Lipase: 29 U/L (ref 11–51)

## 2019-11-15 LAB — CBC
HCT: 46.1 % (ref 39.0–52.0)
Hemoglobin: 15.6 g/dL (ref 13.0–17.0)
MCH: 29.8 pg (ref 26.0–34.0)
MCHC: 33.8 g/dL (ref 30.0–36.0)
MCV: 88 fL (ref 80.0–100.0)
Platelets: 191 10*3/uL (ref 150–400)
RBC: 5.24 MIL/uL (ref 4.22–5.81)
RDW: 12.9 % (ref 11.5–15.5)
WBC: 12.6 10*3/uL — ABNORMAL HIGH (ref 4.0–10.5)
nRBC: 0 % (ref 0.0–0.2)

## 2019-11-15 IMAGING — CT CT ABD-PELV W/ CM
3 of 6 series · 16 of 46 positions shown, 18 images · IV contrast (APPLIED)
Comparison: None.

CLINICAL DATA: RIGHT lower quadrant abdominal pain. Concern for
appendicitis. Crohn's disease.

EXAM:
CT ABDOMEN AND PELVIS WITH CONTRAST
TECHNIQUE: Multidetector CT imaging of the abdomen and pelvis was performed
using the standard protocol following bolus administration of
intravenous contrast.
CONTRAST:  100 mL Omnipaque

[Series 2: routine abd/pel with · axial · 0.73mm/px · z∈[+533,+903]mm · 11 of 90 slices shown, 13 images]
[im 8/90  soft-tissue]
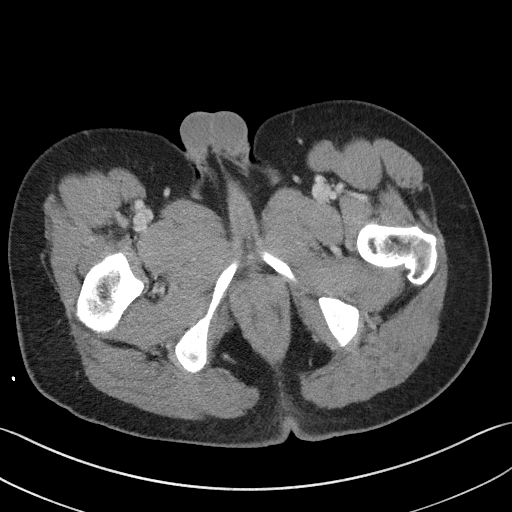
[im 8/90  bone]
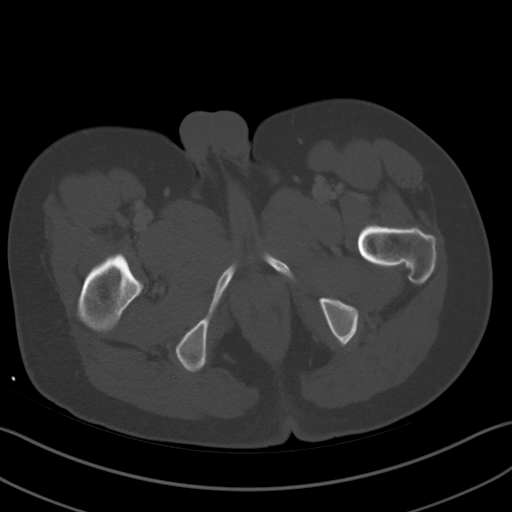
[im 15/90  soft-tissue]
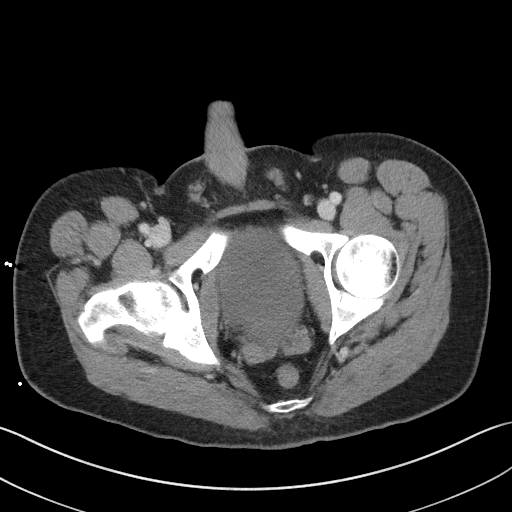
[im 23/90  soft-tissue]
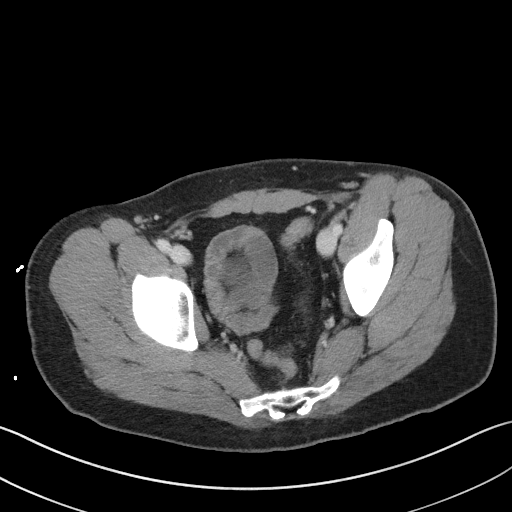
[im 30/90  soft-tissue]
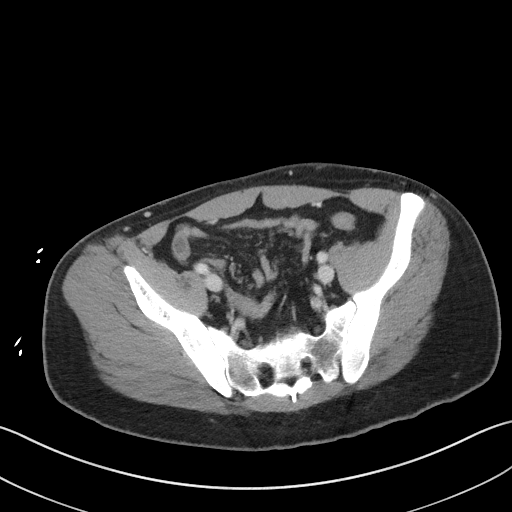
[im 38/90  soft-tissue]
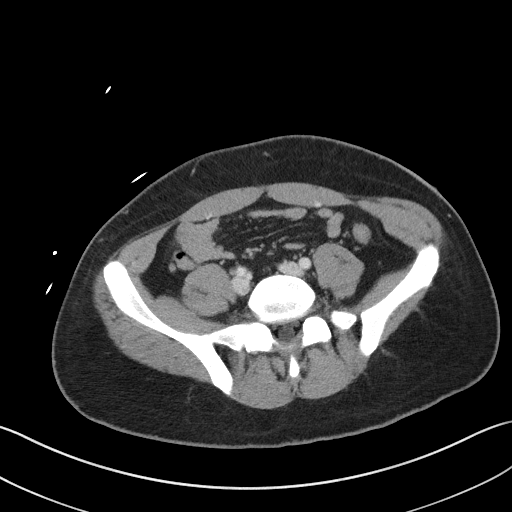
[im 45/90  soft-tissue]
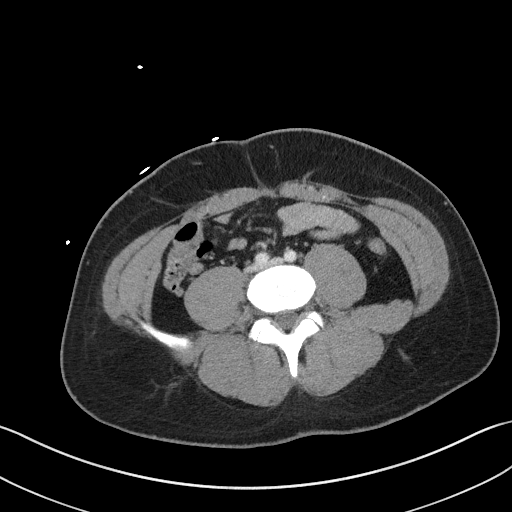
[im 52/90  soft-tissue]
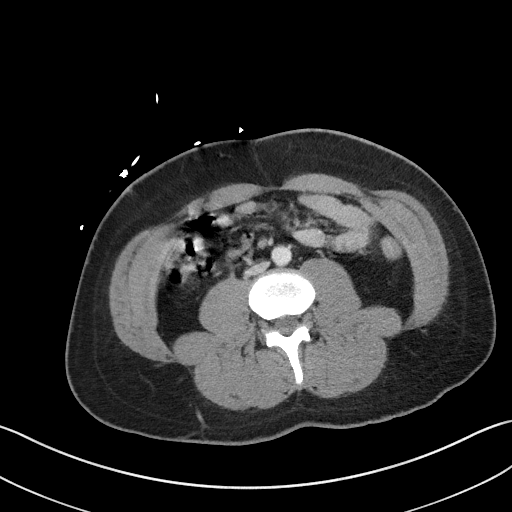
[im 60/90  soft-tissue]
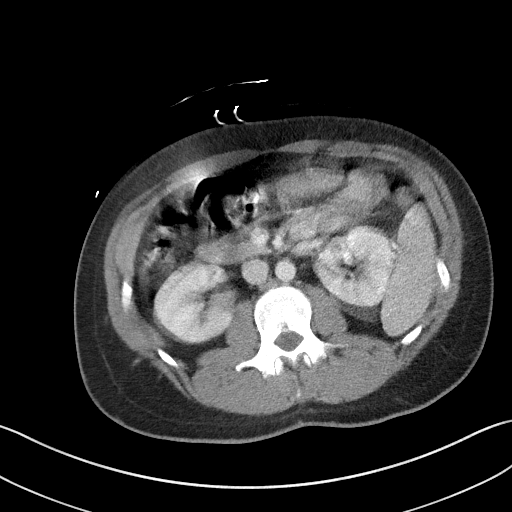
[im 67/90  soft-tissue]
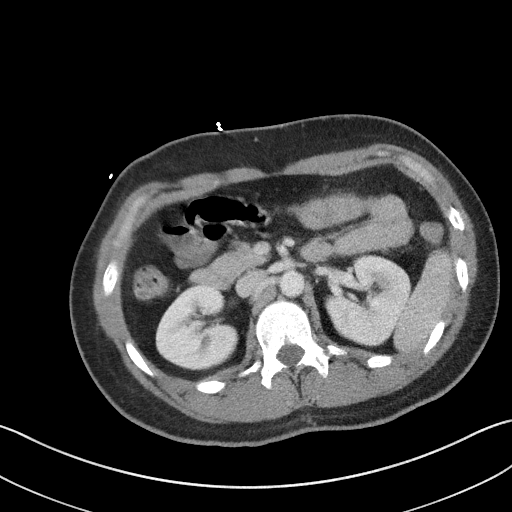
[im 67/90  bone]
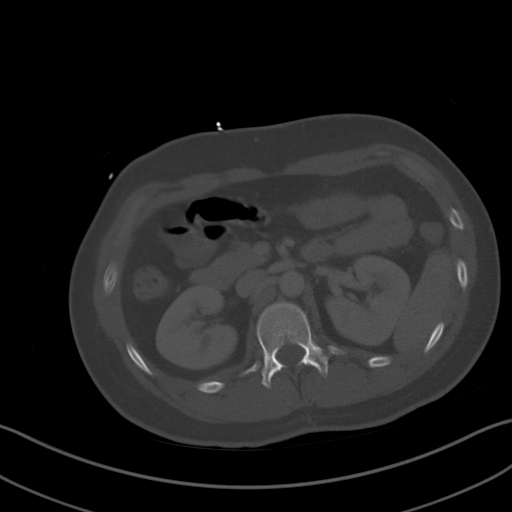
[im 75/90  soft-tissue]
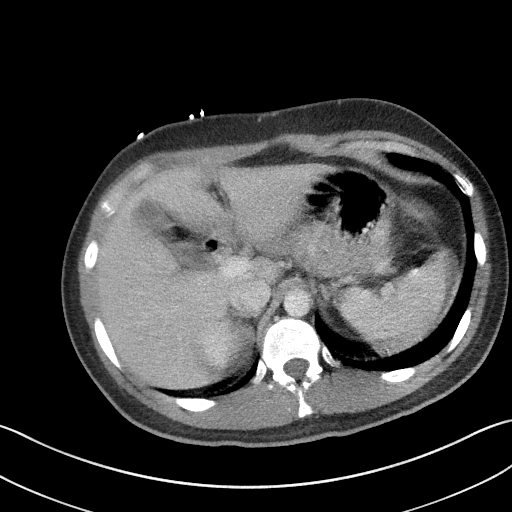
[im 82/90  soft-tissue]
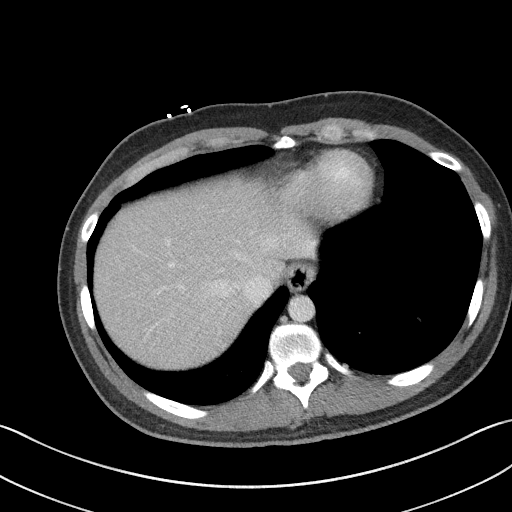

[Series 6: lung · axial · 0.73mm/px · z∈[+863,+903]mm · 2 of 25 slices shown]
[im 9/25  bone]
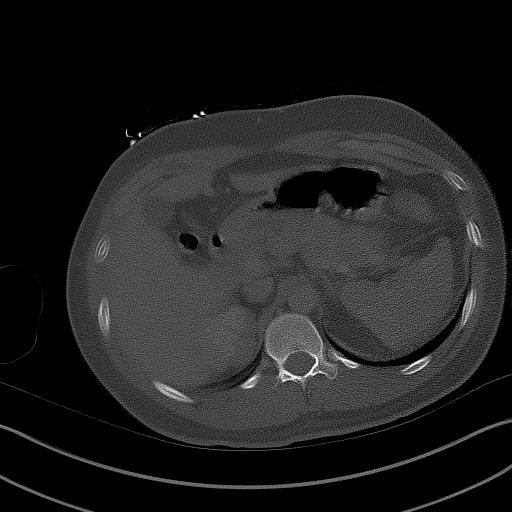
[im 17/25  bone]
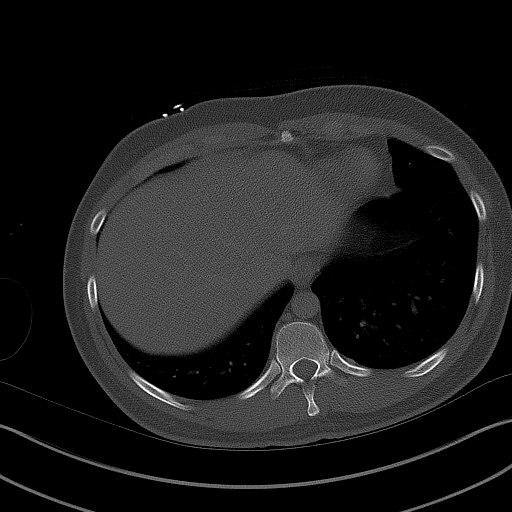

[Series 8: coronal st · coronal · 0.67mm/px · 3 of 94 slices shown]
[im 32/94  soft-tissue]
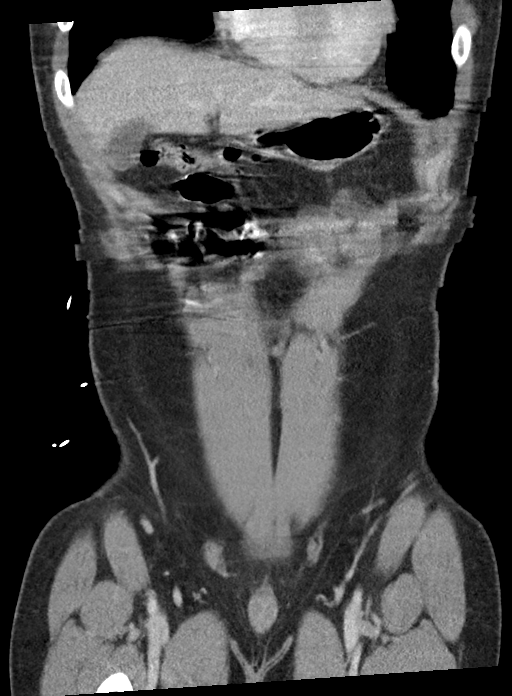
[im 42/94  soft-tissue]
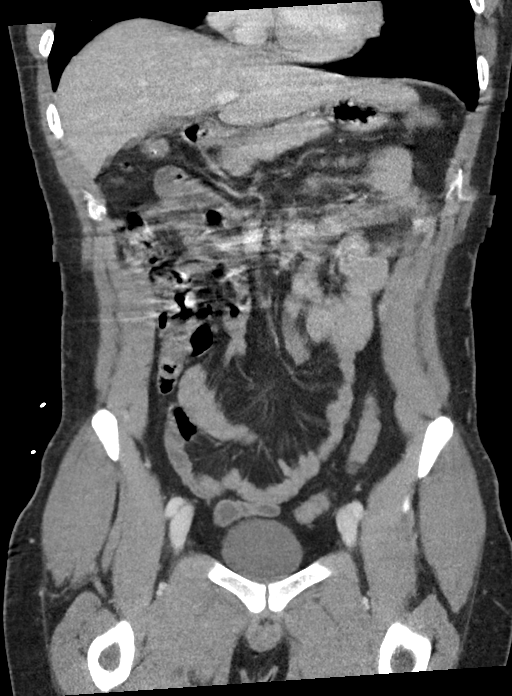
[im 52/94  soft-tissue]
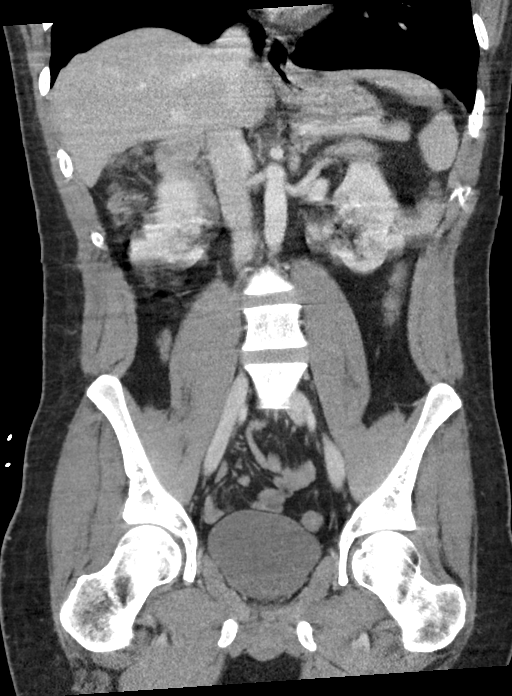

[16 of 46 positions shown; findings below may reference images not displayed]

FINDINGS: Patient difficulty holding still. Exam was repeated with some
improvement but there is still significant motion artifact.

Lower chest: Lung bases are clear.

Hepatobiliary: No focal hepatic lesion. No biliary duct dilatation.
Gallbladder is normal. Common bile duct is normal.

Pancreas: Pancreas is normal. No ductal dilatation. No pancreatic
inflammation.

Spleen: Normal spleen

Adrenals/urinary tract: Adrenal glands and kidneys are normal. The
ureters and bladder normal.

Stomach/Bowel: Stomach, small bowel, appendix, and cecum are normal.
Terminal ileum appears normal. No inflammation. Appendix extends
retrocecal and has no associated inflammation (image 49/4).

The colon and rectosigmoid colon are normal.

Vascular/Lymphatic: Abdominal aorta is normal caliber. No periportal
or retroperitoneal adenopathy. No pelvic adenopathy.

Reproductive: Unremarkable

Other: No free fluid.

Musculoskeletal: No aggressive osseous lesion.
IMPRESSION: 1. No evidence of acute appendicitis.
2. No evidence of inflammatory bowel disease.
3. Exam degraded by patient motion

## 2019-11-15 MED ORDER — HYDROMORPHONE HCL 1 MG/ML IJ SOLN
1.0000 mg | Freq: Once | INTRAMUSCULAR | Status: AC
  Administered 2019-11-15: 1 mg via INTRAVENOUS
  Filled 2019-11-15: qty 1

## 2019-11-15 MED ORDER — ONDANSETRON HCL 4 MG/2ML IJ SOLN
4.0000 mg | Freq: Once | INTRAMUSCULAR | Status: AC
  Administered 2019-11-15: 4 mg via INTRAVENOUS
  Filled 2019-11-15: qty 2

## 2019-11-15 MED ORDER — DICYCLOMINE HCL 10 MG PO CAPS: 10.0000 mg | ORAL_CAPSULE | Freq: Four times a day (QID) | ORAL | 0 refills | Status: DC

## 2019-11-15 MED ORDER — SODIUM CHLORIDE 0.9 % IV SOLN
1000.0000 mL | Freq: Once | INTRAVENOUS | Status: AC
  Administered 2019-11-15: 1000 mL via INTRAVENOUS

## 2019-11-15 MED ORDER — DICYCLOMINE HCL 10 MG PO CAPS
10.0000 mg | ORAL_CAPSULE | Freq: Once | ORAL | Status: AC
  Administered 2019-11-15: 10 mg via ORAL
  Filled 2019-11-15: qty 1

## 2019-11-15 MED ORDER — LORAZEPAM 2 MG/ML IJ SOLN
0.5000 mg | Freq: Once | INTRAMUSCULAR | Status: AC
  Administered 2019-11-15: 0.5 mg via INTRAVENOUS
  Filled 2019-11-15: qty 1

## 2019-11-15 MED ORDER — MORPHINE SULFATE (PF) 4 MG/ML IV SOLN
4.0000 mg | Freq: Once | INTRAVENOUS | Status: AC
  Administered 2019-11-15: 4 mg via INTRAVENOUS
  Filled 2019-11-15: qty 1

## 2019-11-15 MED ORDER — IOHEXOL 300 MG/ML  SOLN
100.0000 mL | Freq: Once | INTRAMUSCULAR | Status: AC | PRN
  Administered 2019-11-15: 100 mL via INTRAVENOUS

## 2019-11-15 MED ORDER — ONDANSETRON 4 MG PO TBDP: 4.0000 mg | ORAL_TABLET | Freq: Three times a day (TID) | ORAL | 0 refills | Status: DC | PRN

## 2019-11-15 NOTE — ED Triage Notes (Signed)
Pt to ED by EMS from home with c/o of abdominal pain. Pt states dx with Crohns approx 3 weeks ago. Pt states since dx pain has increased and is now unbearable. Upon EMS arrival pt with LOC, once alert pt dyphoretic and had emesis occurrence.

## 2019-11-15 NOTE — ED Notes (Signed)
This RN received call from CT stating that they were unable to preform CT scan on pt due to his inability to lay still/flat on the stretcher.

## 2019-11-15 NOTE — ED Notes (Signed)
Pt transported to CT ?

## 2019-11-15 NOTE — ED Notes (Signed)
This RN assisted pt in removing his fannel pants. Pt then requested this RN place pants in the trash. RN did as requested.

## 2019-11-15 NOTE — ED Notes (Signed)
Pt appears much calmer. Able to uncurl from fetal position, able to breathe calmly. Pt appears much less anxious, reporting "I feel like maybe all the paid medications they've given me have finally started to kick in"

## 2019-11-15 NOTE — ED Provider Notes (Signed)
[facility] Emergency Department Provider Note   ____________________________________________    I have reviewed the triage vital signs and the nursing notes.   HISTORY  Chief Complaint Abdominal Pain     HPI Michael Woodward is a 29 y.o. male with history of diabetes who presents with complaints of abdominal pain.  Patient reports nearly a week of abdominal discomfort, became worse this morning, started in his lower abdomen, periumbilical area and is now more in his right lower quadrant/right flank.  Reports mild nausea, sweating.  Has not take anything for this.  No history of abdominal surgery reported.  Does report a history of peptic ulcers.  3 weeks ago told that he had a blood test that could be indicative of Crohn's disease but no formal diagnosis  Past Medical History:  Diagnosis Date  . Allergy   . Anxiety   . Asthma   . Depression   . Diabetes mellitus without complication (HCC)   . H pylori ulcer   . Hypertension   . Seizures [facility])     Patient Active Problem List   Diagnosis Date Noted  . Sinus pain 10/14/2019  . Migraines 03/06/2019  . Type 2 diabetes mellitus without complication, without long-term current use of insulin (HCC) 01/26/2019  . Seizure disorder (HCC) 01/26/2019  . Bipolar disorder (HCC) 01/26/2019  . Essential hypertension 01/26/2019  . 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 TONSILLECTOMY    . TYMPANOSTOMY TUBE PLACEMENT      Prior to Admission medications   Medication Sig Start Date End Date Taking? Authorizing Provider  ALPRAZolam Prudy Feeler) 0.5 MG tablet Take 1 tablet (0.5 mg total) by mouth as needed for anxiety (once daily as needed for SEVERE anxiety only). 10/14/19   Cannady, Corrie Dandy T, NP  cloNIDine (CATAPRES) 0.1 MG tablet Take 1 tablet (0.1 mg total) by mouth 2 (two) times daily as needed (for panic attack). 09/03/19   Cannady, Corrie Dandy T, NP    dicyclomine (BENTYL) 10 MG capsule Take 1 capsule (10 mg total) by mouth 4 (four) times daily for 14 days. 11/15/19 11/29/19  Jene Every, MD  gabapentin (NEURONTIN) 400 MG capsule Take 1 capsule (400 mg total) by mouth 3 (three) times daily. 09/03/19   Cannady, Corrie Dandy T, NP  lamoTRIgine (LAMICTAL) 200 MG tablet Take 1 tablet (200 mg total) by mouth 2 (two) times daily. 09/03/19   Cannady, Corrie Dandy T, NP  lisinopril (ZESTRIL) 5 MG tablet Take 1 tablet (5 mg total) by mouth daily. 10/14/19   Cannady, Corrie Dandy T, NP  ondansetron (ZOFRAN ODT) 4 MG disintegrating tablet Take 1 tablet (4 mg total) by mouth every 8 (eight) hours as needed. 11/15/19   Jene Every, MD  QUEtiapine (SEROQUEL) 100 MG tablet Take 100 mg by mouth daily. 10/20/19   [provider]  rizatriptan (MAXALT) 5 MG tablet TAKE 1 TABLET BY MOUTH AS NEEDED FOR MIGRAINE. MAY REPEAT IN 2 HOURS IF NEEDED 07/28/19   Cannady, [name], NP  VIIBRYD 20 MG TABS Take 1 tablet (20 mg total) by mouth daily with breakfast. 09/03/19   Marjie Skiff, NP     Allergies Phenergan [promethazine hcl]  Family History  Problem Relation Age of Onset  . Hypertension Mother   . Diabetes Mother   . COPD Maternal Grandmother     Social History Social History   Tobacco Use  . Smoking status: Current Every  Day Smoker    Packs/day: 0.50    Types: Cigarettes  . Smokeless tobacco: Never Used  Substance Use Topics  . Alcohol use: Yes    Comment: on occasion  . Drug use: Yes    Types: Marijuana    Review of Systems  Constitutional: No fever/chills Eyes: No visual changes.  ENT: No sore throat. Cardiovascular: Denies chest pain. Respiratory: Denies shortness of breath. Gastrointestinal: As above Genitourinary: Negative for dysuria. Musculoskeletal: Negative for back pain. Skin: Negative for rash. Neurological: Negative for headaches   ____________________________________________   PHYSICAL EXAM:  VITAL SIGNS: ED Triage Vitals   Enc Vitals Group     BP 11/15/19 1449 116/70     Pulse Rate 11/15/19 1449 70     Resp 11/15/19 1449 18     Temp 11/15/19 1449 98.6 F (37 C)     Temp Source 11/15/19 1449 Oral     SpO2 11/15/19 1442 97 %     Weight 11/15/19 1452 71.2 kg (157 lb)     Height 11/15/19 1452 1.651 m (5\' 5" )     Head Circumference --      Peak Flow --      Pain Score 11/15/19 1451 10     Pain Loc --      Pain Edu? --      Excl. in [place]? --     Constitutional: Alert and oriented.   Nose: No congestion/rhinnorhea. Mouth/Throat: Mucous membranes are moist.   Neck:  Painless ROM Cardiovascular: Normal rate, regular rhythm. Grossly normal heart sounds.  Good peripheral circulation. Respiratory: Normal respiratory effort.  No retractions. Lungs CTAB. Gastrointestinal: Soft, tenderness palpation right lower quadrant. No distention.  No CVA tenderness.  Musculoskeletal: No lower extremity tenderness nor edema.   Neurologic:  Normal speech and language. No gross focal neurologic deficits are appreciated.  Skin:  Skin is warm, dry and intact. No rash noted. Psychiatric: Mood and affect are normal. Speech and behavior are normal.  ____________________________________________   LABS (all labs ordered are listed, but only abnormal results are displayed)  Labs Reviewed  CBC - Abnormal; Notable for the following components:      Result Value   WBC 12.6 (*)    All other components within normal limits  URINALYSIS, COMPLETE (UACMP) WITH MICROSCOPIC - Abnormal; Notable for the following components:   Color, Urine YELLOW (*)    APPearance CLEAR (*)    Specific Gravity, Urine 1.036 (*)    Ketones, ur 80 (*)    All other components within normal limits  COMPREHENSIVE METABOLIC PANEL  LIPASE, BLOOD   ____________________________________________  EKG  None ____________________________________________  RADIOLOGY  CT abdomen  pelvis ____________________________________________   PROCEDURES  Procedure(s) performed: No  Procedures   Critical Care performed: No ____________________________________________   INITIAL IMPRESSION / ASSESSMENT AND PLAN / ED COURSE  Pertinent labs & imaging results that were available during my care of the patient were reviewed by me and considered in my medical decision making (see chart for details).  Patient presents with abdominal pain as described above.  Differential includes appendicitis, ureterolithiasis, colitis/enteritis.  Will treat with IV fluids, IV morphine, IV Zofran.  Labs pending.  Imaging pending.  Lab work significant for mild elevation of white blood cell count, lipase, chemistry is normal.  CT abdomen pelvis ordered  Patient with continued pain, given IV Dilaudid prior to CT.  Patient apparently did not tolerate lying flat for CT, was extremely rude to CT tech called her various names.  I instructed patient that his behavior was inappropriate and that expect him to treat our staff with respect  We will add on IV Ativan and reattempt CT   Able to obtain CT which was unremarkable, normal appendix. Patient felt better after IV Ativan especially. Will dispo with Bentyl and Zofran outpatient follow-up, return precautions discussed    ____________________________________________   FINAL CLINICAL IMPRESSION(S) / ED DIAGNOSES  Final diagnoses:  Generalized abdominal pain        Note:  This document was prepared using Dragon voice recognition software and may include unintentional dictation errors.   Jene Every, MD 11/15/19 2133

## 2019-11-17 ENCOUNTER — Telehealth: Payer: Self-pay | Admitting: Nurse Practitioner

## 2019-11-17 NOTE — Telephone Encounter (Signed)
Called pt to schedule appt, he states that he can only do a virtual at this time due to being on muscle relaxer's 4 times a day. Scheduled virtual for 11/19/19  Copied from CRM [id number]. Topic: General - Other >> Nov 17, 2019  9:08 AM Darron Doom wrote: Reason for CRM: Patient called to speak to a nurse states that she was taken to the ER over the weekend by ambulance due to severe pain. Per patient her White Blood cell count is elevated and would like to speak to the nurse. Can be reached at Ph# [phone]

## 2019-11-17 NOTE — Telephone Encounter (Signed)
Noted, however would prefer in office to obtain repeat labs.  Will maintain current follow-up though at this time.

## 2019-11-19 ENCOUNTER — Telehealth (INDEPENDENT_AMBULATORY_CARE_PROVIDER_SITE_OTHER): Payer: Medicaid Other | Admitting: Nurse Practitioner

## 2019-11-19 ENCOUNTER — Encounter: Payer: Self-pay | Admitting: Nurse Practitioner

## 2019-11-19 VITALS — BP 122/86 | HR 66 | Wt 157.5 lb

## 2019-11-19 DIAGNOSIS — R1084 Generalized abdominal pain: Secondary | ICD-10-CM

## 2019-11-19 DIAGNOSIS — F17210 Nicotine dependence, cigarettes, uncomplicated: Secondary | ICD-10-CM

## 2019-11-19 DIAGNOSIS — E119 Type 2 diabetes mellitus without complications: Secondary | ICD-10-CM | POA: Diagnosis not present

## 2019-11-19 DIAGNOSIS — F1290 Cannabis use, unspecified, uncomplicated: Secondary | ICD-10-CM | POA: Insufficient documentation

## 2019-11-19 DIAGNOSIS — Z113 Encounter for screening for infections with a predominantly sexual mode of transmission: Secondary | ICD-10-CM | POA: Diagnosis not present

## 2019-11-19 MED ORDER — CLONIDINE HCL 0.1 MG PO TABS: 0.1000 mg | ORAL_TABLET | Freq: Two times a day (BID) | ORAL | 4 refills | Status: AC | PRN

## 2019-11-19 MED ORDER — VIIBRYD 20 MG PO TABS: 20.0000 mg | ORAL_TABLET | Freq: Every day | ORAL | 4 refills | Status: DC

## 2019-11-19 MED ORDER — QUETIAPINE FUMARATE 100 MG PO TABS: 100.0000 mg | ORAL_TABLET | Freq: Every day | ORAL | 4 refills | Status: DC

## 2019-11-19 MED ORDER — LISINOPRIL 5 MG PO TABS: 5.0000 mg | ORAL_TABLET | Freq: Every day | ORAL | 4 refills | Status: DC

## 2019-11-19 MED ORDER — LAMOTRIGINE 200 MG PO TABS: 200.0000 mg | ORAL_TABLET | Freq: Two times a day (BID) | ORAL | 4 refills | Status: AC

## 2019-11-19 MED ORDER — GABAPENTIN 400 MG PO CAPS: 400.0000 mg | ORAL_CAPSULE | Freq: Three times a day (TID) | ORAL | 4 refills | Status: AC

## 2019-11-19 NOTE — Assessment & Plan Note (Signed)
Acute flare improving at this time, continue Bentyl.  He would like referral to GI for further work-up, as was told in past by a provider there was suspicion for Crohn's + reports history of peptic ulcers.  Recommend at this time he continue Bentyl daily and Zofran as needed.  Recent CT scan was unremarkable.  Will obtain outpatient labs to include CMP, CBC, CRP, TSH, UA, and STD screening.  Recommend keeping food journal at home and avoiding foods that cause flares.  Return to office in 3 months for follow-up, sooner if worsening pain.

## 2019-11-19 NOTE — Patient Instructions (Signed)

## 2019-11-19 NOTE — Assessment & Plan Note (Signed)
Ongoing and stable with last A1C 5.0%. Continue off medication and focus on diet/exercise.   Recommend continue to monitor BS at home daily.  Praised for success.  Recheck A1C outpatient.

## 2019-11-19 NOTE — Progress Notes (Signed)
BP 122/86   Pulse 66   Wt 157 lb 8 oz (71.4 kg)   SpO2 99%   BMI 26.21 kg/m    Subjective:    Patient ID: Michael Woodward, male    DOB: 1990-12-21, 29 y.o.   MRN: [id number]  HPI: [name] is a 29 y.o. male  Chief Complaint  Patient presents with  . ER Follow Up    . This visit was completed via MyChart due to the restrictions of the COVID-19 pandemic. All issues as above were discussed and addressed. Physical exam was done as above through visual confirmation on MyChart. If it was felt that the patient should be evaluated in the office, they were directed there. The patient verbally consented to this visit. . Location of the patient: home . Location of the provider: work . Those involved with this call:  . Provider: Aura Dials, DNP . CMA: Wilhemena Durie, CMA . Front Desk/Registration: Adela Ports  . Time spent on call: 20 minutes with patient face to face via video conference. More than 50% of this time was spent in counseling and coordination of care. 10 minutes total spent in review of patient's record and preparation of their chart.  . I verified patient identity using two factors (patient name and date of birth). Patient consents verbally to being seen via telemedicine visit today.    ER FOLLOW UP Seen at [facility] ER on 11/15/19, [place] for his mother & grandmother.  He presented for abdominal pain for one week that became worse that morning.  He reports history of peptic ulcers and told ER he had a blood test 3 weeks ago that showed Crohn's disease, but has not formal diagnosis.  CT abdomen was performed and was unremarkable with a normal appendix.  He was given Ativan, which improved pain.  Discharged home on Bentyl and Zofran.    Labs did note WBC 12.6, no diff to assess neutrophils. Urine noted 80 ketones and no glucose or nitrites.  CMP WNL and lipase normal.  At this time reports "doing a whole lot better",  continues to have occasional spasms starts at belly button and diffuse all over discomfort but feels this is his IBD having flared due to eating corn.   Does have history of treatment for T2DM, but recent A1C in March was 5%.   Time since discharge: 3 days Hospital/facility: [facility] Diagnosis: abdominal pain Procedures/tests: CT scan and labs Consultants: none New medications: Bentyl and Zofran Discharge instructions:  Follow-up with PCP Status: better   STD SCREENING Would like STD screening, had unprotected sex recently with a previous male partner. Sexual activity:  Recent unprotected sexual encounter Contraception: no Recent unprotected intercourse: yes History of sexually transmitted diseases: no Previous sexually transmitted disease screening: yes Lifetime sexual partners:  Genital lesions: no Penile discharge: no Dysuria: no Swollen lymph nodes: no Fevers: no Rash: no  Relevant past medical, surgical, family and social history reviewed and updated as indicated. Interim medical history since our last visit reviewed. Allergies and medications reviewed and updated.  Review of Systems  Constitutional: Negative for activity change, chills, diaphoresis, fatigue and fever.  Respiratory: Negative for cough, chest tightness, shortness of breath and wheezing.   Cardiovascular: Negative for chest pain, palpitations and leg swelling.  Gastrointestinal: Negative for abdominal distention, abdominal pain (improved), constipation, diarrhea, nausea and vomiting.  Genitourinary: Negative.   Neurological: Negative for dizziness, seizures, syncope, weakness, light-headedness, numbness and headaches.  Psychiatric/Behavioral: Negative.     Per HPI unless specifically indicated above     Objective:    BP 122/86   Pulse 66   Wt 157 lb 8 oz (71.4 kg)   SpO2 99%   BMI 26.21 kg/m   Wt Readings from Last 3 Encounters:  11/19/19 157 lb 8 oz (71.4 kg)  11/15/19 157 lb (71.2 kg)  11/12/19  157 lb 12.8 oz (71.6 kg)    Physical Exam Vitals and nursing note reviewed.  Constitutional:      General: He is awake. He is not in acute distress.    Appearance: He is well-developed. He is not ill-appearing.  HENT:     Head: Normocephalic.     Right Ear: Hearing normal. No drainage.     Left Ear: Hearing normal. No drainage.  Eyes:     General: Lids are normal.        Right eye: No discharge.        Left eye: No discharge.     Conjunctiva/sclera: Conjunctivae normal.  Pulmonary:     Effort: Pulmonary effort is normal. No accessory muscle usage or respiratory distress.  Musculoskeletal:     Cervical back: Normal range of motion.  Neurological:     Mental Status: He is alert and oriented to person, place, and time.  Psychiatric:        Mood and Affect: Mood normal.        Behavior: Behavior normal. Behavior is cooperative.        Thought Content: Thought content normal.        Judgment: Judgment normal.     Results for orders placed or performed during the hospital encounter of 11/15/19  CBC  Result Value Ref Range   WBC 12.6 (H) 4.0 - 10.5 K/uL   RBC 5.24 4.22 - 5.81 MIL/uL   Hemoglobin 15.6 13.0 - 17.0 g/dL   HCT 46.1 39.0 - 52.0 %   MCV 88.0 80.0 - 100.0 fL   MCH 29.8 26.0 - 34.0 pg   MCHC 33.8 30.0 - 36.0 g/dL   RDW 12.9 11.5 - 15.5 %   Platelets 191 150 - 400 K/uL   nRBC 0.0 0.0 - 0.2 %  Comprehensive metabolic panel  Result Value Ref Range   Sodium 140 135 - 145 mmol/L   Potassium 3.9 3.5 - 5.1 mmol/L   Chloride 108 98 - 111 mmol/L   CO2 23 22 - 32 mmol/L   Glucose, Bld 82 70 - 99 mg/dL   BUN 14 6 - 20 mg/dL   Creatinine, Ser 0.88 0.61 - 1.24 mg/dL   Calcium 8.9 8.9 - 10.3 mg/dL   Total Protein 7.2 6.5 - 8.1 g/dL   Albumin 4.3 3.5 - 5.0 g/dL   AST 20 15 - 41 U/L   ALT 16 0 - 44 U/L   Alkaline Phosphatase 61 38 - 126 U/L   Total Bilirubin 0.7 0.3 - 1.2 mg/dL   GFR calc non Af Amer >60 >60 mL/min   GFR calc Af Amer >60 >60 mL/min   Anion gap 9 5 - 15    Lipase, blood  Result Value Ref Range   Lipase 29 11 - 51 U/L  Urinalysis, Complete w Microscopic  Result Value Ref Range   Color, Urine YELLOW (A) YELLOW   APPearance CLEAR (A) CLEAR   Specific Gravity, Urine 1.036 (H) 1.005 - 1.030   pH 6.0 5.0 - 8.0   Glucose, UA NEGATIVE NEGATIVE mg/dL  Hgb urine dipstick NEGATIVE NEGATIVE   Bilirubin Urine NEGATIVE NEGATIVE   Ketones, ur 80 (A) NEGATIVE mg/dL   Protein, ur NEGATIVE NEGATIVE mg/dL   Nitrite NEGATIVE NEGATIVE   Leukocytes,Ua NEGATIVE NEGATIVE   RBC / HPF 0-5 0 - 5 RBC/hpf   WBC, UA 0-5 0 - 5 WBC/hpf   Bacteria, UA NONE SEEN NONE SEEN   Squamous Epithelial / LPF NONE SEEN 0 - 5   Mucus PRESENT       Assessment & Plan:   Problem List Items Addressed This Visit      Endocrine   Type 2 diabetes mellitus without complication, without long-term current use of insulin (HCC) - Primary    Ongoing and stable with last A1C 5.0%. Continue off medication and focus on diet/exercise.   Recommend continue to monitor BS at home daily.  Praised for success.  Recheck A1C outpatient.      Relevant Medications   lisinopril (ZESTRIL) 5 MG tablet   Other Relevant Orders   Bayer DCA Hb A1c Waived     Other   Nicotine dependence, cigarettes, uncomplicated    I have recommended complete cessation of tobacco use. I have discussed various options available for assistance with tobacco cessation including over the counter methods (Nicotine gum, patch and lozenges). We also discussed prescription options (Chantix, Nicotine Inhaler / Nasal Spray). The patient is not interested in pursuing any prescription tobacco cessation options at this time.       Generalized abdominal pain    Acute flare improving at this time, continue Bentyl.  He would like referral to GI for further work-up, as was told in past by a provider there was suspicion for Crohn's + reports history of peptic ulcers.  Recommend at this time he continue Bentyl daily and Zofran as  needed.  Recent CT scan was unremarkable.  Will obtain outpatient labs to include CMP, CBC, CRP, TSH, UA, and STD screening.  Recommend keeping food journal at home and avoiding foods that cause flares.  Return to office in 3 months for follow-up, sooner if worsening pain.      Relevant Orders   CBC with Differential/Platelet   UA/M w/rflx Culture, Routine   Comprehensive metabolic panel   Ambulatory referral to Gastroenterology   TSH   C-reactive protein   Screen for STD (sexually transmitted disease)    Obtain STD screening labs to include GC/Chlam, RPR, HIV, HSV testing. Recommend he practice safe sexual intercourse methods.        Relevant Orders   HIV Antibody (routine testing w rflx)   HSV(herpes simplex vrs) 1+2 ab-IgG   RPR   GC/Chlamydia Probe Amp      I discussed the assessment and treatment plan with the patient. The patient was provided an opportunity to ask questions and all were answered. The patient agreed with the plan and demonstrated an understanding of the instructions.   The patient was advised to call back or seek an in-person evaluation if the symptoms worsen or if the condition fails to improve as anticipated.   I provided 21+ minutes of time during this encounter.  Follow up plan: Return in about 3 months (around 02/19/2020) for T2DM, Seizure Disorder, MOOD, HTN, IBD.

## 2019-11-19 NOTE — Assessment & Plan Note (Signed)
I have recommended complete cessation of tobacco use. I have discussed various options available for assistance with tobacco cessation including over the counter methods (Nicotine gum, patch and lozenges). We also discussed prescription options (Chantix, Nicotine Inhaler / Nasal Spray). The patient is not interested in pursuing any prescription tobacco cessation options at this time.  

## 2019-11-19 NOTE — Assessment & Plan Note (Signed)
Obtain STD screening labs to include GC/Chlam, RPR, HIV, HSV testing. Recommend he practice safe sexual intercourse methods.

## 2019-11-20 ENCOUNTER — Telehealth: Payer: Self-pay | Admitting: Nurse Practitioner

## 2019-11-20 ENCOUNTER — Emergency Department
Admission: EM | Admit: 2019-11-20 | Discharge: 2019-11-21 | Disposition: A | Discharge status: Home / Self Care | Payer: Medicaid Other | Attending: Emergency Medicine | Admitting: Emergency Medicine

## 2019-11-20 ENCOUNTER — Other Ambulatory Visit: Payer: Self-pay

## 2019-11-20 DIAGNOSIS — F17210 Nicotine dependence, cigarettes, uncomplicated: Secondary | ICD-10-CM | POA: Insufficient documentation

## 2019-11-20 DIAGNOSIS — R112 Nausea with vomiting, unspecified: Secondary | ICD-10-CM | POA: Insufficient documentation

## 2019-11-20 DIAGNOSIS — J45909 Unspecified asthma, uncomplicated: Secondary | ICD-10-CM | POA: Diagnosis not present

## 2019-11-20 DIAGNOSIS — R1084 Generalized abdominal pain: Secondary | ICD-10-CM | POA: Insufficient documentation

## 2019-11-20 DIAGNOSIS — F1210 Cannabis abuse, uncomplicated: Secondary | ICD-10-CM | POA: Insufficient documentation

## 2019-11-20 DIAGNOSIS — R197 Diarrhea, unspecified: Secondary | ICD-10-CM | POA: Diagnosis not present

## 2019-11-20 DIAGNOSIS — K2970 Gastritis, unspecified, without bleeding: Secondary | ICD-10-CM

## 2019-11-20 DIAGNOSIS — I10 Essential (primary) hypertension: Secondary | ICD-10-CM | POA: Diagnosis not present

## 2019-11-20 DIAGNOSIS — R109 Unspecified abdominal pain: Secondary | ICD-10-CM | POA: Diagnosis present

## 2019-11-20 DIAGNOSIS — E119 Type 2 diabetes mellitus without complications: Secondary | ICD-10-CM | POA: Insufficient documentation

## 2019-11-20 MED ORDER — MORPHINE SULFATE (PF) 4 MG/ML IV SOLN
4.0000 mg | Freq: Once | INTRAVENOUS | Status: AC
  Administered 2019-11-21: 4 mg via INTRAVENOUS
  Filled 2019-11-20: qty 1

## 2019-11-20 MED ORDER — HALOPERIDOL LACTATE 5 MG/ML IJ SOLN
5.0000 mg | Freq: Once | INTRAMUSCULAR | Status: AC
  Administered 2019-11-21: 5 mg via INTRAVENOUS
  Filled 2019-11-20: qty 1

## 2019-11-20 MED ORDER — SODIUM CHLORIDE 0.9 % IV BOLUS
1000.0000 mL | Freq: Once | INTRAVENOUS | Status: AC
  Administered 2019-11-21: 1000 mL via INTRAVENOUS

## 2019-11-20 MED ORDER — PANTOPRAZOLE SODIUM 40 MG IV SOLR
40.0000 mg | Freq: Once | INTRAVENOUS | Status: AC
  Administered 2019-11-21: 40 mg via INTRAVENOUS
  Filled 2019-11-20: qty 40

## 2019-11-20 NOTE — ED Triage Notes (Addendum)
Pt arrives to ED via POV from home with c/o abdominal pain and N/V/D x3 weeks. Pt reports s/x's have worsened over that time. Pt reports being seen for same recently, but denies having followed up with GI as directed at this time. Pt is A&O, in NAD; RR even, regular, and unlabored. Dr Darnelle Catalan at bedside upon pt's arrival from lobby to ED Rm 3.

## 2019-11-20 NOTE — Telephone Encounter (Signed)
Patient is calling requesting a prior authorization for VIIBRYD 20 MG TABS [[id number]] . Patient states that he only has 3 pills left. Patient was advised to follow back up on Monday morning. Please advise Cb- [phone]

## 2019-11-21 ENCOUNTER — Emergency Department: Payer: Medicaid Other

## 2019-11-21 ENCOUNTER — Encounter: Payer: Self-pay | Admitting: Radiology

## 2019-11-21 LAB — COMPREHENSIVE METABOLIC PANEL
ALT: 26 U/L (ref 0–44)
AST: 30 U/L (ref 15–41)
Albumin: 5 g/dL (ref 3.5–5.0)
Alkaline Phosphatase: 61 U/L (ref 38–126)
Anion gap: 9 (ref 5–15)
BUN: 8 mg/dL (ref 6–20)
CO2: 26 mmol/L (ref 22–32)
Calcium: 9.5 mg/dL (ref 8.9–10.3)
Chloride: 105 mmol/L (ref 98–111)
Creatinine, Ser: 0.91 mg/dL (ref 0.61–1.24)
GFR calc Af Amer: >60 mL/min (ref >60)
GFR calc non Af Amer: >60 mL/min (ref >60)
Glucose, Bld: 91 mg/dL (ref 70–99)
Potassium: 3.3 mmol/L — ABNORMAL LOW (ref 3.5–5.1)
Sodium: 140 mmol/L (ref 135–145)
Total Bilirubin: 0.9 mg/dL (ref 0.3–1.2)
Total Protein: 7.7 g/dL (ref 6.5–8.1)

## 2019-11-21 LAB — CBC WITH DIFFERENTIAL/PLATELET
Abs Immature Granulocytes: 0.02 10*3/uL (ref 0.00–0.07)
Basophils Absolute: 0.1 10*3/uL (ref 0.0–0.1)
Basophils Relative: 1 %
Eosinophils Absolute: 0.1 10*3/uL (ref 0.0–0.5)
Eosinophils Relative: 2 %
HCT: 45.4 % (ref 39.0–52.0)
Hemoglobin: 16.6 g/dL (ref 13.0–17.0)
Immature Granulocytes: 0 %
Lymphocytes Relative: 37 %
Lymphs Abs: 3.2 10*3/uL (ref 0.7–4.0)
MCH: 30.5 pg (ref 26.0–34.0)
MCHC: 36.6 g/dL — ABNORMAL HIGH (ref 30.0–36.0)
MCV: 83.3 fL (ref 80.0–100.0)
Monocytes Absolute: 0.9 10*3/uL (ref 0.1–1.0)
Monocytes Relative: 10 %
Neutro Abs: 4.6 10*3/uL (ref 1.7–7.7)
Neutrophils Relative %: 50 %
Platelets: 217 10*3/uL (ref 150–400)
RBC: 5.45 MIL/uL (ref 4.22–5.81)
RDW: 12.9 % (ref 11.5–15.5)
WBC: 8.9 10*3/uL (ref 4.0–10.5)
nRBC: 0 % (ref 0.0–0.2)

## 2019-11-21 LAB — URINE DRUG SCREEN, QUALITATIVE (ARMC ONLY)
Amphetamines, Ur Screen: NOT DETECTED
Barbiturates, Ur Screen: NOT DETECTED
Benzodiazepine, Ur Scrn: NOT DETECTED
Cannabinoid 50 Ng, Ur ~~LOC~~: POSITIVE — AB
Cocaine Metabolite,Ur ~~LOC~~: NOT DETECTED
MDMA (Ecstasy)Ur Screen: NOT DETECTED
Methadone Scn, Ur: NOT DETECTED
Opiate, Ur Screen: NOT DETECTED
Phencyclidine (PCP) Ur S: NOT DETECTED
Tricyclic, Ur Screen: NOT DETECTED

## 2019-11-21 LAB — URINALYSIS, COMPLETE (UACMP) WITH MICROSCOPIC
Bilirubin Urine: NEGATIVE
Glucose, UA: NEGATIVE mg/dL
Hgb urine dipstick: NEGATIVE
Ketones, ur: 5 mg/dL — AB
Leukocytes,Ua: NEGATIVE
Nitrite: NEGATIVE
Protein, ur: NEGATIVE mg/dL
Specific Gravity, Urine: 1.01 (ref 1.005–1.030)
pH: 7 (ref 5.0–8.0)

## 2019-11-21 LAB — LACTIC ACID, PLASMA: Lactic Acid, Venous: 1.6 mmol/L (ref 0.5–1.9)

## 2019-11-21 LAB — ETHANOL: Alcohol, Ethyl (B): <10 mg/dL (ref <10)

## 2019-11-21 MED ORDER — IOHEXOL 300 MG/ML  SOLN
100.0000 mL | Freq: Once | INTRAMUSCULAR | Status: AC | PRN
  Administered 2019-11-21: 100 mL via INTRAVENOUS

## 2019-11-21 MED ORDER — METOCLOPRAMIDE HCL 10 MG PO TABS
10.0000 mg | ORAL_TABLET | Freq: Once | ORAL | Status: AC
  Administered 2019-11-21: 10 mg via ORAL
  Filled 2019-11-21: qty 1

## 2019-11-21 MED ORDER — FAMOTIDINE 20 MG PO TABS
40.0000 mg | ORAL_TABLET | Freq: Once | ORAL | Status: AC
  Administered 2019-11-21: 40 mg via ORAL
  Filled 2019-11-21: qty 2

## 2019-11-21 MED ORDER — FAMOTIDINE 20 MG PO TABS
20.0000 mg | ORAL_TABLET | Freq: Two times a day (BID) | ORAL | 0 refills | Status: DC
Start: 2019-11-21 — End: 2020-02-26

## 2019-11-21 MED ORDER — METOCLOPRAMIDE HCL 10 MG PO TABS
10.0000 mg | ORAL_TABLET | Freq: Four times a day (QID) | ORAL | 0 refills | Status: DC | PRN
Start: 2019-11-21 — End: 2020-03-15

## 2019-11-21 MED ORDER — SUCRALFATE 1 G PO TABS
1.0000 g | ORAL_TABLET | Freq: Once | ORAL | Status: AC
  Administered 2019-11-21: 1 g via ORAL
  Filled 2019-11-21: qty 1

## 2019-11-21 MED ORDER — SUCRALFATE 1 G PO TABS
1.0000 g | ORAL_TABLET | Freq: Four times a day (QID) | ORAL | 1 refills | Status: DC
Start: 2019-11-21 — End: 2020-03-15

## 2019-11-21 NOTE — Discharge Instructions (Signed)
Your lab tests and CT scan today were all okay.  We were unable to find an exact cause of your symptoms, but your evaluation is generally reassuring.  I suspect your symptoms are related to stomach inflammation.  Please avoid spicy, acidic, or high-fat foods, which can all aggravate these symptoms.  Also avoid anti-inflammatory medicines such as ibuprofen (advil) and naproxen (aleve) and marijuana, which can worsen these symptoms as well.

## 2019-11-21 NOTE — ED Provider Notes (Signed)
[facility] Emergency Department Provider Note  ____________________________________________  Time seen: Approximately 1:56 AM  I have reviewed the triage vital signs and the nursing notes.   HISTORY  Chief Complaint Abdominal Pain    HPI Michael Woodward is a 29 y.o. male with a history of anxiety depression diabetes hypertension seizures and peptic ulcer disease who comes the ED complaining of diffuse abdominal pain with nausea vomiting and diarrhea for the past 3 weeks, waxing and waning, worse with trying to eat, no alleviating factors.  No fevers or chills.  Reports that he is still waiting for a GI appointment after contacting the clinic for follow-up from her previous visit.  He notes that his urine has seemed very dark with decreased urine output recently.  Denies black or bloody stool or hematemesis.      Past Medical History:  Diagnosis Date  . Allergy   . Anxiety   . Asthma   . Depression   . Diabetes mellitus without complication (HCC)   . H pylori ulcer   . Hypertension   . Seizures [facility])      Patient Active Problem List   Diagnosis Date Noted  . Nicotine dependence, cigarettes, uncomplicated 11/19/2019  . Marijuana use 11/19/2019  . Generalized abdominal pain 11/19/2019  . Screen for STD (sexually transmitted disease) 11/19/2019  . Migraines 03/06/2019  . Type 2 diabetes mellitus without complication, without long-term current use of insulin (HCC) 01/26/2019  . Seizure disorder (HCC) 01/26/2019  . Bipolar disorder (HCC) 01/26/2019  . Essential hypertension 01/26/2019     Past Surgical History:  Procedure Laterality Date  . TONSILLECTOMY    . TYMPANOSTOMY TUBE PLACEMENT       Prior to Admission medications   Medication Sig Start Date End Date Taking? Authorizing Provider  ALPRAZolam Prudy Feeler) 0.5 MG tablet Take 1 tablet (0.5 mg total) by mouth as needed for anxiety (once daily as needed for SEVERE anxiety only). 10/14/19    Cannady, Corrie Dandy T, NP  cloNIDine (CATAPRES) 0.1 MG tablet Take 1 tablet (0.1 mg total) by mouth 2 (two) times daily as needed (for panic attack). 11/19/19   Cannady, Corrie Dandy T, NP  dicyclomine (BENTYL) 10 MG capsule Take 1 capsule (10 mg total) by mouth 4 (four) times daily for 14 days. 11/15/19 11/29/19  Jene Every, MD  famotidine (PEPCID) 20 MG tablet Take 1 tablet (20 mg total) by mouth 2 (two) times daily. 11/21/19   Sharman Cheek, MD  gabapentin (NEURONTIN) 400 MG capsule Take 1 capsule (400 mg total) by mouth 3 (three) times daily. 11/19/19   Cannady, Corrie Dandy T, NP  lamoTRIgine (LAMICTAL) 200 MG tablet Take 1 tablet (200 mg total) by mouth 2 (two) times daily. 11/19/19   Cannady, Corrie Dandy T, NP  lisinopril (ZESTRIL) 5 MG tablet Take 1 tablet (5 mg total) by mouth daily. 11/19/19   Cannady, Corrie Dandy T, NP  metoCLOPramide (REGLAN) 10 MG tablet Take 1 tablet (10 mg total) by mouth every 6 (six) hours as needed. 11/21/19   Sharman Cheek, MD  ondansetron (ZOFRAN ODT) 4 MG disintegrating tablet Take 1 tablet (4 mg total) by mouth every 8 (eight) hours as needed. 11/15/19   Jene Every, MD  QUEtiapine (SEROQUEL) 100 MG tablet Take 1 tablet (100 mg total) by mouth daily. 11/19/19   Cannady, [name], NP  rizatriptan (MAXALT) 5 MG tablet TAKE 1 TABLET BY MOUTH AS NEEDED FOR MIGRAINE. MAY REPEAT IN 2 HOURS IF NEEDED 07/28/19   Marjie Skiff, NP  sucralfate (CARAFATE) 1 g tablet Take 1 tablet (1 g total) by mouth 4 (four) times daily. 11/21/19   Sharman Cheek, MD  VIIBRYD 20 MG TABS Take 1 tablet (20 mg total) by mouth daily with breakfast. 11/19/19   Marjie Skiff, NP     Allergies Phenergan [promethazine hcl]   Family History  Problem Relation Age of Onset  . Hypertension Mother   . Diabetes Mother   . COPD Maternal Grandmother     Social History Social History   Tobacco Use  . Smoking status: Current Every Day Smoker    Packs/day: 0.50    Types: Cigarettes  . Smokeless tobacco: Never  Used  Substance Use Topics  . Alcohol use: Yes    Comment: on occasion  . Drug use: Yes    Types: Marijuana    Review of Systems  Constitutional:   No fever or chills.  ENT:   No sore throat. No rhinorrhea. Cardiovascular:   No chest pain or syncope. Respiratory:   No dyspnea or cough. Gastrointestinal:   Positive as above for abdominal pain, vomiting and diarrhea.  Musculoskeletal:   Negative for focal pain or swelling All other systems reviewed and are negative except as documented above in ROS and HPI.  ____________________________________________   PHYSICAL EXAM:  VITAL SIGNS: ED Triage Vitals [11/20/19 2357]  Enc Vitals Group     BP 116/65     Pulse Rate 84     Resp 16     Temp 98.5 F (36.9 C)     Temp Source Oral     SpO2 100 %     Weight 157 lb (71.2 kg)     Height 5\' 5"  (1.651 m)     Head Circumference      Peak Flow      Pain Score 8     Pain Loc      Pain Edu?      Excl. in GC?     Vital signs reviewed, nursing assessments reviewed.   Constitutional:   Alert and oriented. Non-toxic appearance. Eyes:   Conjunctivae are injected bilaterally. EOMI. PERRL. ENT      Head:   Normocephalic and atraumatic.      Nose:   Normal.      Mouth/Throat:   Dry mucous membranes, oropharyngeal erythema without swelling or uvula deviation.      Neck:   No meningismus. Full ROM. Hematological/Lymphatic/Immunilogical:   No cervical lymphadenopathy. Cardiovascular:   RRR. Symmetric bilateral radial and DP pulses.  No murmurs. Cap refill about 4 seconds. Respiratory:   Normal respiratory effort without tachypnea/retractions. Breath sounds are clear and equal bilaterally. No wheezes/rales/rhonchi. Gastrointestinal:   Soft without focal tenderness. Non distended. There is no CVA tenderness.  No rebound, rigidity, or guarding. Musculoskeletal:   Normal range of motion in all extremities. No joint effusions.  No lower extremity tenderness.  No edema. Neurologic:   Normal  speech and language.  Motor grossly intact. No acute focal neurologic deficits are appreciated.  Skin:    Skin is warm, dry and intact. No rash noted.  No petechiae, purpura, or bullae.  ____________________________________________    LABS (pertinent positives/negatives) (all labs ordered are listed, but only abnormal results are displayed) Labs Reviewed  COMPREHENSIVE METABOLIC PANEL - Abnormal; Notable for the following components:      Result Value   Potassium 3.3 (*)    All other components within normal limits  CBC WITH DIFFERENTIAL/PLATELET - Abnormal; Notable for the following  components:   MCHC 36.6 (*)    All other components within normal limits  BLOOD GAS, VENOUS - Abnormal; Notable for the following components:   pH, Ven 7.50 (*)    pCO2, Ven 32 (*)    Acid-Base Excess 2.6 (*)    All other components within normal limits  URINALYSIS, COMPLETE (UACMP) WITH MICROSCOPIC - Abnormal; Notable for the following components:   Color, Urine YELLOW (*)    APPearance CLEAR (*)    Ketones, ur 5 (*)    Bacteria, UA RARE (*)    All other components within normal limits  URINE DRUG SCREEN, QUALITATIVE ([facility] ONLY) - Abnormal; Notable for the following components:   Cannabinoid 50 Ng, Ur [place] POSITIVE (*)    All other components within normal limits  ETHANOL  LACTIC ACID, PLASMA   ____________________________________________   EKG  Interpreted by me Sinus tachycardia rate 100.  Normal axis and intervals.  Normal QRS and ST segments.  T wave inversions in high lateral leads, nonacute in appearance.  No ischemic changes.  ____________________________________________    RADIOLOGY  CT ABDOMEN PELVIS W CONTRAST  Result Date: 11/21/2019 CLINICAL DATA:  Abdominal pain, nausea/vomiting EXAM: CT ABDOMEN AND PELVIS WITH CONTRAST TECHNIQUE: Multidetector CT imaging of the abdomen and pelvis was performed using the standard protocol following bolus administration of intravenous contrast.  CONTRAST:  OMNIPAQUE IOHEXOL 300 MG/ML  SOLN COMPARISON:  11/15/2019 FINDINGS: Lower chest: Lung bases are clear. Hepatobiliary: Liver is within normal limits, noting focal fat/altered perfusion along the false form ligament. Gallbladder is unremarkable. No intrahepatic or extrahepatic ductal dilatation. Pancreas: Within normal limits. Spleen: Within normal limits. Adrenals/Urinary Tract: Adrenal glands are within normal limits. Kidneys are within normal limits.  No hydronephrosis. Bladder is within normal limits. Stomach/Bowel: Stomach is within normal limits. No evidence of bowel obstruction. Normal appendix (series 2/image 51). Vascular/Lymphatic: No evidence of abdominal aortic aneurysm. No suspicious abdominopelvic lymphadenopathy. Reproductive: Prostate is unremarkable. Other: No abdominopelvic ascites. Musculoskeletal: Visualized osseous structures are within normal limits. IMPRESSION: Negative CT abdomen/pelvis. No interval change from recent CT. Electronically Signed   By: Charline Bills M.D.   On: 11/21/2019 01:03    ____________________________________________   PROCEDURES Procedures  ____________________________________________  DIFFERENTIAL DIAGNOSIS   Appendicitis, bowel obstruction, diverticulitis, DKA, dehydration, gastroparesis, pancreatitis, gastritis/GERD, cannabinoid hyperemesis  CLINICAL IMPRESSION / ASSESSMENT AND PLAN / ED COURSE  Medications ordered in the ED: Medications  sodium chloride 0.9 % bolus 1,000 mL (1,000 mLs Intravenous New Bag/Given 11/21/19 0017)  morphine 4 MG/ML injection 4 mg (4 mg Intravenous Given 11/21/19 0018)  haloperidol lactate (HALDOL) injection 5 mg (5 mg Intravenous Given 11/21/19 0018)  pantoprazole (PROTONIX) injection 40 mg (40 mg Intravenous Given 11/21/19 0018)  iohexol (OMNIPAQUE) 300 MG/ML solution 100 mL (100 mLs Intravenous Contrast Given 11/21/19 0053)  metoCLOPramide (REGLAN) tablet 10 mg (10 mg Oral Given 11/21/19 0152)  famotidine  (PEPCID) tablet 40 mg (40 mg Oral Given 11/21/19 0152)  sucralfate (CARAFATE) tablet 1 g (1 g Oral Given 11/21/19 0152)    Pertinent labs & imaging results that were available during my care of the patient were reviewed by me and considered in my medical decision making (see chart for details).  Jaideep Pollack was evaluated in Emergency Department on 11/21/2019 for the symptoms described in the history of present illness. He was evaluated in the context of the global COVID-19 pandemic, which necessitated consideration that the patient might be at risk for infection with the SARS-CoV-2 virus that causes  COVID-19. Institutional protocols and algorithms that pertain to the evaluation of patients at risk for COVID-19 are in a state of rapid change based on information released by regulatory bodies including the CDC and federal and state organizations. These policies and algorithms were followed during the patient's care in the ED.   Patient presents with generalized abdominal pain, vomiting, p.o. intolerance for the past week or more.  With his comorbidities, he will need labs, IV fluids for hydration, morphine 4 mg IV and Haldol 5 mg IV for pain and nausea relief.  I will also order IV Protonix for stomach acid suppression.  Will need to repeat CT scan due to motion degraded study last time and persistent symptoms.  ----------------------------------------- 2:04 AM on 11/21/2019 -----------------------------------------  Work-up benign, labs unremarkable, CT scan unremarkable.  Urinalysis normal.  Urine drug screen positive for cannabinoid.  At this point differential is narrowed to gastritis versus cannabinoid hyperemesis.  Patient is feeling much better.  I will continue him on Carafate Reglan and Pepcid until he can follow-up with gastroenterology.  Advised on liquid diet and gradually progressing to bland diet, sparing use of muscle relaxer, avoiding NSAIDs alcohol and marijuana.       ____________________________________________   FINAL CLINICAL IMPRESSION(S) / ED DIAGNOSES    Final diagnoses:  Generalized abdominal pain  Gastritis without bleeding, unspecified chronicity, unspecified gastritis type     ED Discharge Orders         Ordered    famotidine (PEPCID) 20 MG tablet  2 times daily     11/21/19 0154    metoCLOPramide (REGLAN) 10 MG tablet  Every 6 hours PRN     11/21/19 0154    sucralfate (CARAFATE) 1 g tablet  4 times daily     11/21/19 0154          Portions of this note were generated with dragon dictation software. Dictation errors may occur despite best attempts at proofreading.   Carrie Mew, MD 11/21/19 [phone]

## 2019-11-23 ENCOUNTER — Telehealth (INDEPENDENT_AMBULATORY_CARE_PROVIDER_SITE_OTHER): Payer: Medicaid Other | Admitting: Nurse Practitioner

## 2019-11-23 ENCOUNTER — Encounter: Payer: Self-pay | Admitting: Nurse Practitioner

## 2019-11-23 ENCOUNTER — Telehealth: Payer: Self-pay | Admitting: Nurse Practitioner

## 2019-11-23 VITALS — BP 127/92 | HR 98 | Wt 149.0 lb

## 2019-11-23 DIAGNOSIS — R1084 Generalized abdominal pain: Secondary | ICD-10-CM | POA: Diagnosis not present

## 2019-11-23 NOTE — Telephone Encounter (Signed)
Copied from CRM [id number]. Topic: General - Other >> Nov 23, 2019 11:31 AM Elliot Gault wrote: Reason for CRM: patient inquiring about most recent  EKG results that was taken in the [facility] ED best # [phone], patient would like a follow up call today

## 2019-11-23 NOTE — Telephone Encounter (Signed)
No, he had visit and it was discussed.  Thank you.

## 2019-11-23 NOTE — Telephone Encounter (Signed)
Please let him know EKG showed some mild sinus tachycardia with his heart rate 100, which could have been related to discomfort, but no acute changes of concern per note and on review of EKG.

## 2019-11-23 NOTE — Telephone Encounter (Signed)
Routing to PCP

## 2019-11-23 NOTE — Progress Notes (Signed)
BP (!) 127/92 (BP Location: Left Arm, Patient Position: Sitting, Cuff Size: Normal)   Pulse 98   Wt 149 lb (67.6 kg)   SpO2 99%   BMI 24.79 kg/m    Subjective:    Patient ID: Michael Woodward, male    DOB: 04/09/1991, 29 y.o.   MRN: [id number]  HPI: Michael Woodward is a 29 y.o. male presenting for ER follow up  Chief Complaint  Patient presents with  . ER Follow-up  . Abnormal ECG    Patient states her EKG from the ED is concerning.   . Weight Loss    Patient has lost 13lbs in two weeks   ER FOLLOW UP Patient went to the [facility] ER on 11/20/2019 with abdominal pain.  He reports that his stomach still hurts but he is dealing with it.  He thinks the carafate is helping the most.  Today, he is requesting a referral for a gastroenterologist in [facility] as this is where he is living most of the time currently.  He has been eating more frequent small meals which seems to be helping with the nausea and vomiting. Time since discharge: 3 days Hospital/facility: [facility] Diagnosis: gastritis vs. Cannabinoid hyperemesis Procedures/tests: CT abdomen and pelvis with contrast was negative  Consultants:  New medications: pepcid 20 mg bid, reglan 10mg  q6 hours as needed, and carafate 1g 4 times daily Discharge instructions:  Continue new medications, follow up with GI, liquid diet, avoid use of muscle relaxant, NSAIDs, alcohol, and marijuana Status: better  Patient voiced concerns about his heart rate and EKG while at the hospital.  Allergies  Allergen Reactions  . Phenergan [Promethazine Hcl]     Cause seizures    Outpatient Encounter Medications as of 11/23/2019  Medication Sig  . ALPRAZolam (XANAX) 0.5 MG tablet Take 1 tablet (0.5 mg total) by mouth as needed for anxiety (once daily as needed for SEVERE anxiety only).  . cloNIDine (CATAPRES) 0.1 MG tablet Take 1 tablet (0.1 mg total) by mouth 2 (two) times daily as needed (for panic attack).  . dicyclomine (BENTYL) 10 MG capsule Take 1 capsule  (10 mg total) by mouth 4 (four) times daily for 14 days.  . famotidine (PEPCID) 20 MG tablet Take 1 tablet (20 mg total) by mouth 2 (two) times daily.  01/23/2020 gabapentin (NEURONTIN) 400 MG capsule Take 1 capsule (400 mg total) by mouth 3 (three) times daily.  Marland Kitchen lamoTRIgine (LAMICTAL) 200 MG tablet Take 1 tablet (200 mg total) by mouth 2 (two) times daily.  Marland Kitchen lisinopril (ZESTRIL) 5 MG tablet Take 1 tablet (5 mg total) by mouth daily.  . metoCLOPramide (REGLAN) 10 MG tablet Take 1 tablet (10 mg total) by mouth every 6 (six) hours as needed.  . Multiple Vitamin (MULTIVITAMIN PO) Take 1 Dose by mouth daily.  . ondansetron (ZOFRAN ODT) 4 MG disintegrating tablet Take 1 tablet (4 mg total) by mouth every 8 (eight) hours as needed.  Marland Kitchen QUEtiapine (SEROQUEL) 100 MG tablet Take 1 tablet (100 mg total) by mouth daily.  . rizatriptan (MAXALT) 5 MG tablet TAKE 1 TABLET BY MOUTH AS NEEDED FOR MIGRAINE. MAY REPEAT IN 2 HOURS IF NEEDED  . sucralfate (CARAFATE) 1 g tablet Take 1 tablet (1 g total) by mouth 4 (four) times daily.  Marland Kitchen VIIBRYD 20 MG TABS Take 1 tablet (20 mg total) by mouth daily with breakfast.   No facility-administered encounter medications on file as of 11/23/2019.   Patient Active Problem List  Diagnosis Date Noted  . Nicotine dependence, cigarettes, uncomplicated 11/19/2019  . Marijuana use 11/19/2019  . Generalized abdominal pain 11/19/2019  . Screen for STD (sexually transmitted disease) 11/19/2019  . Migraines 03/06/2019  . Type 2 diabetes mellitus without complication, without long-term current use of insulin (HCC) 01/26/2019  . Seizure disorder (HCC) 01/26/2019  . Bipolar disorder (HCC) 01/26/2019  . Essential hypertension 01/26/2019   Past Medical History:  Diagnosis Date  . Allergy   . Anxiety   . Asthma   . Depression   . Diabetes mellitus without complication (HCC)   . H pylori ulcer   . Hypertension   . Seizures (HCC)    Relevant past medical, surgical, family and social  history reviewed and updated as indicated. Interim medical history since our last visit reviewed.  Review of Systems  Per HPI unless specifically indicated above     Objective:    BP (!) 127/92 (BP Location: Left Arm, Patient Position: Sitting, Cuff Size: Normal)   Pulse 98   Wt 149 lb (67.6 kg)   SpO2 99%   BMI 24.79 kg/m   Wt Readings from Last 3 Encounters:  11/23/19 149 lb (67.6 kg)  11/20/19 157 lb (71.2 kg)  11/19/19 157 lb 8 oz (71.4 kg)    Physical Exam  Results for orders placed or performed during the hospital encounter of 11/20/19  Comprehensive metabolic panel  Result Value Ref Range   Sodium 140 135 - 145 mmol/L   Potassium 3.3 (L) 3.5 - 5.1 mmol/L   Chloride 105 98 - 111 mmol/L   CO2 26 22 - 32 mmol/L   Glucose, Bld 91 70 - 99 mg/dL   BUN 8 6 - 20 mg/dL   Creatinine, Ser 6.56 0.61 - 1.24 mg/dL   Calcium 9.5 8.9 - 81.2 mg/dL   Total Protein 7.7 6.5 - 8.1 g/dL   Albumin 5.0 3.5 - 5.0 g/dL   AST 30 15 - 41 U/L   ALT 26 0 - 44 U/L   Alkaline Phosphatase 61 38 - 126 U/L   Total Bilirubin 0.9 0.3 - 1.2 mg/dL   GFR calc non Af Amer >60 >60 mL/min   GFR calc Af Amer >60 >60 mL/min   Anion gap 9 5 - 15  Ethanol  Result Value Ref Range   Alcohol, Ethyl (B) <10 <10 mg/dL  CBC with Differential  Result Value Ref Range   WBC 8.9 4.0 - 10.5 K/uL   RBC 5.45 4.22 - 5.81 MIL/uL   Hemoglobin 16.6 13.0 - 17.0 g/dL   HCT 75.1 70.0 - 17.4 %   MCV 83.3 80.0 - 100.0 fL   MCH 30.5 26.0 - 34.0 pg   MCHC 36.6 (H) 30.0 - 36.0 g/dL   RDW 94.4 96.7 - 59.1 %   Platelets 217 150 - 400 K/uL   nRBC 0.0 0.0 - 0.2 %   Neutrophils Relative % 50 %   Neutro Abs 4.6 1.7 - 7.7 K/uL   Lymphocytes Relative 37 %   Lymphs Abs 3.2 0.7 - 4.0 K/uL   Monocytes Relative 10 %   Monocytes Absolute 0.9 0.1 - 1.0 K/uL   Eosinophils Relative 2 %   Eosinophils Absolute 0.1 0.0 - 0.5 K/uL   Basophils Relative 1 %   Basophils Absolute 0.1 0.0 - 0.1 K/uL   Immature Granulocytes 0 %   Abs  Immature Granulocytes 0.02 0.00 - 0.07 K/uL  Blood gas, venous  Result Value Ref Range   pH,  Ven 7.50 (H) 7.250 - 7.430   pCO2, Ven 32 (L) 44.0 - 60.0 mmHg   pO2, Ven PENDING 32.0 - 45.0 mmHg   Bicarbonate 25.0 20.0 - 28.0 mmol/L   Acid-Base Excess 2.6 (H) 0.0 - 2.0 mmol/L   O2 Saturation 47.8 %   Patient temperature 37.0    Collection site LINE    Sample type VENOUS   Urinalysis, Complete w Microscopic  Result Value Ref Range   Color, Urine YELLOW (A) YELLOW   APPearance CLEAR (A) CLEAR   Specific Gravity, Urine 1.010 1.005 - 1.030   pH 7.0 5.0 - 8.0   Glucose, UA NEGATIVE NEGATIVE mg/dL   Hgb urine dipstick NEGATIVE NEGATIVE   Bilirubin Urine NEGATIVE NEGATIVE   Ketones, ur 5 (A) NEGATIVE mg/dL   Protein, ur NEGATIVE NEGATIVE mg/dL   Nitrite NEGATIVE NEGATIVE   Leukocytes,Ua NEGATIVE NEGATIVE   WBC, UA 11-20 0 - 5 WBC/hpf   Bacteria, UA RARE (A) NONE SEEN   Squamous Epithelial / LPF 0-5 0 - 5   Mucus PRESENT   Urine Drug Screen, Qualitative  Result Value Ref Range   Tricyclic, Ur Screen NONE DETECTED NONE DETECTED   Amphetamines, Ur Screen NONE DETECTED NONE DETECTED   MDMA (Ecstasy)Ur Screen NONE DETECTED NONE DETECTED   Cocaine Metabolite,Ur [place] NONE DETECTED NONE DETECTED   Opiate, Ur Screen NONE DETECTED NONE DETECTED   Phencyclidine (PCP) Ur S NONE DETECTED NONE DETECTED   Cannabinoid 50 Ng, Ur [place] POSITIVE (A) NONE DETECTED   Barbiturates, Ur Screen NONE DETECTED NONE DETECTED   Benzodiazepine, Ur Scrn NONE DETECTED NONE DETECTED   Methadone Scn, Ur NONE DETECTED NONE DETECTED  Lactic acid, plasma  Result Value Ref Range   Lactic Acid, Venous 1.6 0.5 - 1.9 mmol/L      Assessment & Plan:   Problem List Items Addressed This Visit      Other   Generalized abdominal pain - Primary    Acute, improving with addition of carafate.  Continue all medications prescribed in ED as well as those prescribed by PCP.  Referral generated for GI provider in [facility] per  patient's request.  Recent CT scan in ED unremarkable along with blood work.  Discussed EKG showing sinus tachycardia which could have been due to pain/discomfort.  Encouraged to continue water intake at least 3 liters per day.  Keep follow up with PCP and return to office sooner if needed.          Follow up plan: Return for as scheduled with pcp.  Due to the catastrophic nature of the COVID-19 pandemic, this visit was completed via audio and visual contact via Mychart due to the restrictions of the COVID-19 pandemic. All issues as above were discussed and addressed. Physical exam was done as above through visual confirmation on Mychart. If it was felt that the patient should be evaluated in the office, they were directed there. The patient verbally consented to this visit."} . Location of the patient: home . Location of the provider: work . Those involved with this call:  . Provider: Carnella Guadalajara, DNP . CMA: Merilyn Baba, CMA . Front Desk/Registration: Don Perking  . Time spent on call: 20 minutes on the phone discussing health concerns. 30 minutes total spent in review of patient's record and preparation of their chart.  I verified patient identity using two factors (patient name and date of birth). Patient consents verbally to being seen via telemedicine visit today.

## 2019-11-23 NOTE — Telephone Encounter (Signed)
Patient had a mychart video visit today with Mardene Celeste regarding the issue. Would you still like me to notify patient of this?

## 2019-11-24 ENCOUNTER — Encounter: Payer: Self-pay | Admitting: Nurse Practitioner

## 2019-11-24 NOTE — Telephone Encounter (Signed)
Medication approved   Confirmation # [id number] W Patient notified.

## 2019-11-24 NOTE — Telephone Encounter (Signed)
Called and spoke with pharmacy, they are able to get an emergency supply of 3 days for the patient, the PA has a 24hour turn around, patient notified that pharmacy has short supply and that we should get decision tomorrow.

## 2019-11-24 NOTE — Assessment & Plan Note (Addendum)
Acute, improving with addition of carafate.  Continue all medications prescribed in ED as well as those prescribed by PCP.  Referral generated for GI provider in [facility] per patient's request.  Recent CT scan in ED unremarkable along with blood work.  Discussed EKG showing sinus tachycardia which could have been due to pain/discomfort.  Encouraged to continue water intake at least 3 liters per day.  Keep follow up with PCP and return to office sooner if needed.

## 2019-11-24 NOTE — Patient Instructions (Signed)
Cannabinoid Hyperemesis Syndrome Cannabinoid hyperemesis syndrome (CHS) is a condition that causes repeated nausea, vomiting, and abdominal pain after long-term (chronic) use of marijuana (cannabis). People with CHS typically use marijuana 3-5 times a day for many years before they have symptoms, although it is possible to develop CHS with as little as 1 use per day. Symptoms of CHS may be mild at first but can get worse and more frequent. In some cases, CHS may cause vomiting many times a day, which can lead to weight loss and dehydration. CHS may go away and come back many times (recur). People may not have symptoms or may otherwise be healthy in between CHS attacks. What are the causes? The exact cause of this condition is not known. Long-term use of marijuana may over-stimulate certain proteins in the brain that react with chemicals in marijuana (cannabinoid receptors). This over-stimulation may cause CHS. What are the signs or symptoms? Symptoms of this condition are often mild during the first few attacks, but they can get worse over time. Symptoms may include:  Frequent nausea, especially early in the morning.  Vomiting.  Abdominal pain. Taking several hot showers throughout the day can also be a sign of this condition. People with CHS may do this because it relieves symptoms. How is this diagnosed? This condition may be diagnosed based on:  Your symptoms and medical history, including any drug use.  A physical exam. You may have tests done to rule out other problems. These tests may include:  Blood tests.  Urine tests.  Imaging tests, such as an X-ray or CT scan. How is this treated? Treatment for this condition involves stopping marijuana use. Your health care provider may recommend:  A drug rehabilitation program, if you have trouble stopping marijuana use.  Medicines for nausea.  Hot showers to help relieve symptoms. Certain creams that contain a substance called  capsaicin may improve symptoms when applied to the abdomen. Ask your health care provider before starting any medicines or other treatments. Severe nausea and vomiting may require you to stay at the hospital. You may need IV fluids to prevent or treat dehydration. You may also need certain medicines that must be given at the hospital. Follow these instructions at home: During an attack   Stay in bed and rest in a dark, quiet room.  Take anti-nausea medicine as told by your health care provider.  Try taking hot showers to relieve your symptoms. After an attack  Drink small amounts of clear fluids slowly. Gradually add more.  Once you are able to eat without vomiting, eat soft foods in small amounts every 3-4 hours. General instructions   Do not use any products that contain marijuana.If you need help quitting, ask your health care provider for resources and treatment options.  Drink enough fluid to keep your urine pale yellow. Avoid drinking fluids that have a lot of sugar or caffeine, such as coffee and soda.  Take and apply over-the-counter and prescription medicines only as told by your health care provider. Ask your health care provider before starting any new medicines or treatments.  Keep all follow-up visits as told by your health care provider. This is important. Contact a health care provider if:  Your symptoms get worse.  You cannot drink fluids without vomiting.  You have pain and trouble swallowing after an attack. Get help right away if:  You cannot stop vomiting.  You have blood in your vomit or your vomit looks like coffee grounds.  You have   severe abdominal pain.  You have stools that are bloody or black, or stools that look like tar.  You have symptoms of dehydration, such as: ? Sunken eyes. ? Inability to make tears. ? Cracked lips. ? Dry mouth. ? Decreased urine production. ? Weakness. ? Sleepiness. ? Fainting. Summary  Cannabinoid hyperemesis  syndrome (CHS) is a condition that causes repeated nausea, vomiting, and abdominal pain after long-term use of marijuana.  People with CHS typically use marijuana 3-5 times a day for many years before they have symptoms, although it is possible to develop CHS with as little as 1 use per day.  Treatment for this condition involves stopping marijuana use. Hot showers and capsaicin creams may also help relieve symptoms. Ask your health care provider before starting any medicines or other treatments.  Your health care provider may prescribe medicines to help with nausea.  Get help right away if you have signs of dehydration, such as dry mouth, decreased urine production, or weakness. This information is not intended to replace advice given to you by your health care provider. Make sure you discuss any questions you have with your health care provider. Document Revised: 10/11/2017 Document Reviewed: 09/12/2016 Elsevier Patient Education  2020 Elsevier Inc.  

## 2019-11-24 NOTE — Telephone Encounter (Signed)
PA submitted via  Tracks, will wait for approval or denial.

## 2019-11-25 LAB — BLOOD GAS, VENOUS
Acid-Base Excess: 2.6 mmol/L — ABNORMAL HIGH (ref 0.0–2.0)
Bicarbonate: 25 mmol/L (ref 20.0–28.0)
O2 Saturation: 47.8 %
Patient temperature: 37
pCO2, Ven: 32 mmHg — ABNORMAL LOW (ref 44.0–60.0)
pH, Ven: 7.5 — ABNORMAL HIGH (ref 7.250–7.430)

## 2019-11-26 ENCOUNTER — Telehealth: Payer: Self-pay | Admitting: Nurse Practitioner

## 2019-11-26 NOTE — Telephone Encounter (Signed)
Called pt to schedule f/u appt. Pt states that he has an appt tomorrow at and primary care office, not just a gi specialist. Advised to pt that he can not have more than 1 pcp. Pt states that he moves back and forth between 2 areas due to family situations. Spoke with Jolene, she states that pt can not have 2 pcps and needs to decide where he wants to be and also that for referrals pcp needs to be on medicaid card. Will call pt back and inform him of this information.

## 2019-11-26 NOTE — Telephone Encounter (Signed)
Called pt back and told him again of Jolene's message. He states that he is going to contact the other office and see what they are saying and contact us back. Says that he may try them out advised that if he goes to them that he will have to reestablish with [place].

## 2019-11-26 NOTE — Telephone Encounter (Signed)
Noted and agree.  Patient will need one PCP that he is established with.

## 2019-11-27 NOTE — Telephone Encounter (Signed)
Noted, thank you

## 2019-11-27 NOTE — Telephone Encounter (Signed)
Patient called back and stated that he is going to stay with Jolene as his PCP.  If things change he will inform the office.

## 2019-12-03 ENCOUNTER — Telehealth: Payer: Self-pay | Admitting: Nurse Practitioner

## 2019-12-03 NOTE — Telephone Encounter (Signed)
Routing to provider  

## 2019-12-03 NOTE — Telephone Encounter (Signed)
DPR reviewed. LVM with Jolene's message.

## 2019-12-03 NOTE — Telephone Encounter (Signed)
This can be virtual, but will need notes before visit so that I can review the recommendations.  I would recommend if he is in pain prior to this then to immediately contact GI or go to closest ER for visit.

## 2019-12-03 NOTE — Telephone Encounter (Signed)
Copied from CRM [id number]. Topic: General - Inquiry >> Dec 03, 2019  8:24 AM Reggie Pile, NT wrote: Reason for CRM: Patient called in stating they had a visit with GI yesterday. GI MD recommended that patient get in contact with PCP to have temporary pain management until July 12th, as patient will be having colonoscopy and endoscope. Patient's GI will then take over pain medication. Please advise as patient is needed something for relief as he has been seen in ER for this.

## 2019-12-03 NOTE — Telephone Encounter (Signed)
Spoke with Pt. He will have [facility] notes over. He states GI told him to contact PCP for medication until July 12. He is still wanting to have a visit, would this visit be able to be virtual? He is in [place], [place] currently and is waiting to establish care with a new PCP however it will be 6 weeks before he can get in.

## 2019-12-03 NOTE — Telephone Encounter (Signed)
Please let patient know he will need to contact [facility] and agree to release of records to our office.  Please provide him with fax number for our office so he can provide to them.  Will need these records to review prior to visit with me, that way I can review their recommendations and safely treat if this is recommended.  Thank you.

## 2019-12-03 NOTE — Telephone Encounter (Signed)
Patient would like PCP to contact [facility]  [phone] and request report.

## 2019-12-03 NOTE — Telephone Encounter (Signed)
He would need visit and I would need GI notes on this showing this recommendation.   Currently no GI visit note in chart and I would need to review this note.

## 2019-12-04 ENCOUNTER — Other Ambulatory Visit: Payer: Self-pay | Admitting: Nurse Practitioner

## 2019-12-04 MED ORDER — CYCLOBENZAPRINE HCL 10 MG PO TABS
10.0000 mg | ORAL_TABLET | Freq: Three times a day (TID) | ORAL | 0 refills | Status: DC | PRN
Start: 2019-12-04 — End: 2020-03-15

## 2019-12-04 NOTE — Telephone Encounter (Signed)
Called and left patient a detailed VM letting him know what Jolene said.

## 2019-12-04 NOTE — Telephone Encounter (Signed)
Received notes on patient. Placed in provider folder for review.

## 2019-12-04 NOTE — Telephone Encounter (Signed)
Please alert Sajad I will send in muscle relaxer, but on review of GI note there is no mention of PCP providing pain medication.  It states advised to decrease calories and increase exercise + colonoscopy and EGD.  If worsening pain I recommend he go to ER closest to him for further evaluation or contact GI.

## 2019-12-04 NOTE — Telephone Encounter (Signed)
Called over to [facility] to ask about records. Because we referred the patient to them, they are sending [place] his notes. Provided our fax number to them.  Called and let patient know that was done. Will route to provider when notes are received.

## 2020-01-01 ENCOUNTER — Ambulatory Visit: Payer: Medicaid Other | Admitting: Nurse Practitioner

## 2020-01-04 ENCOUNTER — Other Ambulatory Visit: Payer: Self-pay | Admitting: Nurse Practitioner

## 2020-01-04 DIAGNOSIS — R112 Nausea with vomiting, unspecified: Secondary | ICD-10-CM

## 2020-02-10 ENCOUNTER — Encounter: Payer: Self-pay | Admitting: Nurse Practitioner

## 2020-02-10 ENCOUNTER — Telehealth (INDEPENDENT_AMBULATORY_CARE_PROVIDER_SITE_OTHER): Payer: Medicaid Other | Admitting: Nurse Practitioner

## 2020-02-10 DIAGNOSIS — I10 Essential (primary) hypertension: Secondary | ICD-10-CM

## 2020-02-10 DIAGNOSIS — F3161 Bipolar disorder, current episode mixed, mild: Secondary | ICD-10-CM

## 2020-02-10 MED ORDER — LISINOPRIL 10 MG PO TABS: 10.0000 mg | ORAL_TABLET | Freq: Every day | ORAL | 4 refills | Status: AC

## 2020-02-10 MED ORDER — ALPRAZOLAM 0.5 MG PO TABS: 0.5000 mg | ORAL_TABLET | ORAL | 0 refills | Status: DC | PRN

## 2020-02-10 NOTE — Assessment & Plan Note (Signed)
Ongoing, continue Viibryd and Seroquel + Lamictal.  Continue Clonidine as needed for panic.  Will continue short burst of Xanax 0.5 MG daily as needed for severe anxiety only, #20 pills with no refills sent -- he is to use sparsely and ONLY as needed.  Is aware this is short period use only and not continuous.  He denies SI/HI.  Return to office in upcoming weeks, if able and in [place].  Need labs and in office visit.  Have highly recommended he continue to work on establishing care in [facility], since is living there at this time.

## 2020-02-10 NOTE — Assessment & Plan Note (Addendum)
Ongoing, with exacerbation due to stressors.  Increase Lisinopril to 10 MG daily and adjust as needed, may further increase in one week to 20 MG if BP remains above >130/80.  Continue to monitor BP at home and notify provider if any elevations or low readings.  Return to office in upcoming weeks, if able and in [place].  Need labs and in office visit.  Have highly recommended he continue to work on establishing care in [facility], since is living there at this time.  Discussed with him he is to IMMEDIATELY go to ER if elevations like yesterday and symptomatic, he agrees with this plan.
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
## 2020-02-10 NOTE — Patient Instructions (Signed)
DASH Eating Plan DASH stands for "Dietary Approaches to Stop Hypertension." The DASH eating plan is a healthy eating plan that has been shown to reduce high blood pressure (hypertension). It may also reduce your risk for type 2 diabetes, heart disease, and stroke. The DASH eating plan may also help with weight loss. What are tips for following this plan?  General guidelines  Avoid eating more than 2,300 mg (milligrams) of salt (sodium) a day. If you have hypertension, you may need to reduce your sodium intake to 1,500 mg a day.  Limit alcohol intake to no more than 1 drink a day for nonpregnant women and 2 drinks a day for men. One drink equals 12 oz of beer, 5 oz of wine, or 1 oz of hard liquor.  Work with your health care provider to maintain a healthy body weight or to lose weight. Ask what an ideal weight is for you.  Get at least 30 minutes of exercise that causes your heart to beat faster (aerobic exercise) most days of the week. Activities may include walking, swimming, or biking.  Work with your health care provider or diet and nutrition specialist (dietitian) to adjust your eating plan to your individual calorie needs. Reading food labels   Check food labels for the amount of sodium per serving. Choose foods with less than 5 percent of the Daily Value of sodium. Generally, foods with less than 300 mg of sodium per serving fit into this eating plan.  To find whole grains, look for the word "whole" as the first word in the ingredient list. Shopping  Buy products labeled as "low-sodium" or "no salt added."  Buy fresh foods. Avoid canned foods and premade or frozen meals. Cooking  Avoid adding salt when cooking. Use salt-free seasonings or herbs instead of table salt or sea salt. Check with your health care provider or pharmacist before using salt substitutes.  Do not fry foods. Cook foods using healthy methods such as baking, boiling, grilling, and broiling instead.  Cook with  heart-healthy oils, such as olive, canola, soybean, or sunflower oil. Meal planning  Eat a balanced diet that includes: ? 5 or more servings of fruits and vegetables each day. At each meal, try to fill half of your plate with fruits and vegetables. ? Up to 6-8 servings of whole grains each day. ? Less than 6 oz of lean meat, poultry, or fish each day. A 3-oz serving of meat is about the same size as a deck of cards. One egg equals 1 oz. ? 2 servings of low-fat dairy each day. ? A serving of nuts, seeds, or beans 5 times each week. ? Heart-healthy fats. Healthy fats called Omega-3 fatty acids are found in foods such as flaxseeds and coldwater fish, like sardines, salmon, and mackerel.  Limit how much you eat of the following: ? Canned or prepackaged foods. ? Food that is high in trans fat, such as fried foods. ? Food that is high in saturated fat, such as fatty meat. ? Sweets, desserts, sugary drinks, and other foods with added sugar. ? Full-fat dairy products.  Do not salt foods before eating.  Try to eat at least 2 vegetarian meals each week.  Eat more home-cooked food and less restaurant, buffet, and fast food.  When eating at a restaurant, ask that your food be prepared with less salt or no salt, if possible. What foods are recommended? The items listed may not be a complete list. Talk with your dietitian about   what dietary choices are best for you. Grains Whole-grain or whole-wheat bread. Whole-grain or whole-wheat pasta. Brown rice. Oatmeal. Quinoa. Bulgur. Whole-grain and low-sodium cereals. Pita bread. Low-fat, low-sodium crackers. Whole-wheat flour tortillas. Vegetables Fresh or frozen vegetables (raw, steamed, roasted, or grilled). Low-sodium or reduced-sodium tomato and vegetable juice. Low-sodium or reduced-sodium tomato sauce and tomato paste. Low-sodium or reduced-sodium canned vegetables. Fruits All fresh, dried, or frozen fruit. Canned fruit in natural juice (without  added sugar). Meat and other protein foods Skinless chicken or turkey. Ground chicken or turkey. Pork with fat trimmed off. Fish and seafood. Egg whites. Dried beans, peas, or lentils. Unsalted nuts, nut butters, and seeds. Unsalted canned beans. Lean cuts of beef with fat trimmed off. Low-sodium, lean deli meat. Dairy Low-fat (1%) or fat-free (skim) milk. Fat-free, low-fat, or reduced-fat cheeses. Nonfat, low-sodium ricotta or cottage cheese. Low-fat or nonfat yogurt. Low-fat, low-sodium cheese. Fats and oils Soft margarine without trans fats. Vegetable oil. Low-fat, reduced-fat, or light mayonnaise and salad dressings (reduced-sodium). Canola, safflower, olive, soybean, and sunflower oils. Avocado. Seasoning and other foods Herbs. Spices. Seasoning mixes without salt. Unsalted popcorn and pretzels. Fat-free sweets. What foods are not recommended? The items listed may not be a complete list. Talk with your dietitian about what dietary choices are best for you. Grains Baked goods made with fat, such as croissants, muffins, or some breads. Dry pasta or rice meal packs. Vegetables Creamed or fried vegetables. Vegetables in a cheese sauce. Regular canned vegetables (not low-sodium or reduced-sodium). Regular canned tomato sauce and paste (not low-sodium or reduced-sodium). Regular tomato and vegetable juice (not low-sodium or reduced-sodium). Pickles. Olives. Fruits Canned fruit in a light or heavy syrup. Fried fruit. Fruit in cream or butter sauce. Meat and other protein foods Fatty cuts of meat. Ribs. Fried meat. Bacon. Sausage. Bologna and other processed lunch meats. Salami. Fatback. Hotdogs. Bratwurst. Salted nuts and seeds. Canned beans with added salt. Canned or smoked fish. Whole eggs or egg yolks. Chicken or turkey with skin. Dairy Whole or 2% milk, cream, and half-and-half. Whole or full-fat cream cheese. Whole-fat or sweetened yogurt. Full-fat cheese. Nondairy creamers. Whipped toppings.  Processed cheese and cheese spreads. Fats and oils Butter. Stick margarine. Lard. Shortening. Ghee. Bacon fat. Tropical oils, such as coconut, palm kernel, or palm oil. Seasoning and other foods Salted popcorn and pretzels. Onion salt, garlic salt, seasoned salt, table salt, and sea salt. Worcestershire sauce. Tartar sauce. Barbecue sauce. Teriyaki sauce. Soy sauce, including reduced-sodium. Steak sauce. Canned and packaged gravies. Fish sauce. Oyster sauce. Cocktail sauce. Horseradish that you find on the shelf. Ketchup. Mustard. Meat flavorings and tenderizers. Bouillon cubes. Hot sauce and Tabasco sauce. Premade or packaged marinades. Premade or packaged taco seasonings. Relishes. Regular salad dressings. Where to find more information:  National Heart, Lung, and Blood Institute: www.nhlbi.nih.gov  American Heart Association: www.heart.org Summary  The DASH eating plan is a healthy eating plan that has been shown to reduce high blood pressure (hypertension). It may also reduce your risk for type 2 diabetes, heart disease, and stroke.  With the DASH eating plan, you should limit salt (sodium) intake to 2,300 mg a day. If you have hypertension, you may need to reduce your sodium intake to 1,500 mg a day.  When on the DASH eating plan, aim to eat more fresh fruits and vegetables, whole grains, lean proteins, low-fat dairy, and heart-healthy fats.  Work with your health care provider or diet and nutrition specialist (dietitian) to adjust your eating plan to your   individual calorie needs. This information is not intended to replace advice given to you by your health care provider. Make sure you discuss any questions you have with your health care provider. Document Revised: 05/17/2017 Document Reviewed: 05/28/2016 Elsevier Patient Education  2020 Elsevier Inc.  

## 2020-02-26 ENCOUNTER — Encounter: Payer: Self-pay | Admitting: Family Medicine

## 2020-02-26 ENCOUNTER — Other Ambulatory Visit: Payer: Self-pay | Admitting: Nurse Practitioner

## 2020-02-26 ENCOUNTER — Telehealth (INDEPENDENT_AMBULATORY_CARE_PROVIDER_SITE_OTHER): Payer: Medicaid Other | Admitting: Family Medicine

## 2020-02-26 DIAGNOSIS — J0100 Acute maxillary sinusitis, unspecified: Secondary | ICD-10-CM

## 2020-02-26 MED ORDER — DOXYCYCLINE HYCLATE 100 MG PO TABS
100.0000 mg | ORAL_TABLET | Freq: Two times a day (BID) | ORAL | 0 refills | Status: DC
Start: 2020-02-26 — End: 2020-03-15

## 2020-02-26 MED ORDER — PREDNISONE 50 MG PO TABS: 50.0000 mg | ORAL_TABLET | Freq: Every day | ORAL | 0 refills | Status: DC

## 2020-02-26 MED ORDER — ONDANSETRON 4 MG PO TBDP
4.0000 mg | ORAL_TABLET | Freq: Three times a day (TID) | ORAL | 0 refills | Status: DC | PRN
Start: 2020-02-26 — End: 2020-03-04

## 2020-02-26 NOTE — Progress Notes (Signed)
There were no vitals taken for this visit.   Subjective:    Patient ID: Michael Woodward, male    DOB: [birth year], 29 y.o.   MRN: [id number]  HPI: Michael Woodward is a 29 y.o. male  Chief Complaint  Patient presents with  . URI    pt states he has been having ear pain/pressure, headache, sinus pressure, green/yellow phlegm, congestion (nasal and chest), and some coughing. States symptoms started about 2 weeks ago.    UPPER RESPIRATORY TRACT INFECTION Duration: 2 weeks Worst symptom: ear pressure and congestion Fever: no Cough: yes Shortness of breath: no Wheezing: no Chest pain: no Chest tightness: yes Chest congestion: yes Nasal congestion: yes Runny nose: yes Post nasal drip: yes Sneezing: no Sore throat: no Swollen glands: no Sinus pressure: yes Headache: yes Face pain: yes Toothache: no Ear pain: yes bilateral Ear pressure: yes bilateral Eyes red/itching:no Eye drainage/crusting: no  Vomiting: no Rash: no Fatigue: no Sick contacts: no Strep contacts: no  Context: worse Recurrent sinusitis: no Relief with OTC cold/cough medications: no  Treatments attempted: cold/sinus, mucinex, anti-histamine, pseudoephedrine and cough syrup   Relevant past medical, surgical, family and social history reviewed and updated as indicated. Interim medical history since our last visit reviewed. Allergies and medications reviewed and updated.  Review of Systems  Constitutional: Positive for fatigue. Negative for activity change, appetite change, chills, diaphoresis, fever and unexpected weight change.  HENT: Positive for congestion, ear discharge, postnasal drip, rhinorrhea, sinus pressure and sinus pain. Negative for dental problem, drooling, ear pain, facial swelling, hearing loss, mouth sores, nosebleeds, sneezing, sore throat, tinnitus, trouble swallowing and voice change.     Per HPI unless specifically indicated above     Objective:    There were no vitals taken for this  visit.  Wt Readings from Last 3 Encounters:  11/23/19 149 lb (67.6 kg)  11/20/19 157 lb (71.2 kg)  11/19/19 157 lb 8 oz (71.4 kg)    Physical Exam Vitals and nursing note reviewed.  Constitutional:      General: He is not in acute distress.    Appearance: Normal appearance. He is not ill-appearing, toxic-appearing or diaphoretic.  HENT:     Head: Normocephalic and atraumatic.     Right Ear: External ear normal.     Left Ear: External ear normal.     Nose: Nose normal.     Mouth/Throat:     Mouth: Mucous membranes are moist.     Pharynx: Oropharynx is clear.  Eyes:     General: No scleral icterus.       Right eye: No discharge.        Left eye: No discharge.     Conjunctiva/sclera: Conjunctivae normal.     Pupils: Pupils are equal, round, and reactive to light.  Pulmonary:     Effort: Pulmonary effort is normal. No respiratory distress.     Comments: Speaking in full sentences Musculoskeletal:        General: Normal range of motion.     Cervical back: Normal range of motion.  Skin:    Coloration: Skin is not jaundiced or pale.     Findings: No bruising, erythema, lesion or rash.  Neurological:     Mental Status: He is alert and oriented to person, place, and time. Mental status is at baseline.  Psychiatric:        Mood and Affect: Mood normal.        Behavior: Behavior normal.  Thought Content: Thought content normal.        Judgment: Judgment normal.     Results for orders placed or performed during the hospital encounter of 11/20/19  Comprehensive metabolic panel  Result Value Ref Range   Sodium 140 135 - 145 mmol/L   Potassium 3.3 (L) 3.5 - 5.1 mmol/L   Chloride 105 98 - 111 mmol/L   CO2 26 22 - 32 mmol/L   Glucose, Bld 91 70 - 99 mg/dL   BUN 8 6 - 20 mg/dL   Creatinine, Ser 0.99 0.61 - 1.24 mg/dL   Calcium 9.5 8.9 - 83.3 mg/dL   Total Protein 7.7 6.5 - 8.1 g/dL   Albumin 5.0 3.5 - 5.0 g/dL   AST 30 15 - 41 U/L   ALT 26 0 - 44 U/L   Alkaline  Phosphatase 61 38 - 126 U/L   Total Bilirubin 0.9 0.3 - 1.2 mg/dL   GFR calc non Af Amer >60 >60 mL/min   GFR calc Af Amer >60 >60 mL/min   Anion gap 9 5 - 15  Ethanol  Result Value Ref Range   Alcohol, Ethyl (B) <10 <10 mg/dL  CBC with Differential  Result Value Ref Range   WBC 8.9 4.0 - 10.5 K/uL   RBC 5.45 4.22 - 5.81 MIL/uL   Hemoglobin 16.6 13.0 - 17.0 g/dL   HCT 82.5 39 - 52 %   MCV 83.3 80.0 - 100.0 fL   MCH 30.5 26.0 - 34.0 pg   MCHC 36.6 (H) 30.0 - 36.0 g/dL   RDW 05.3 97.6 - 73.4 %   Platelets 217 150 - 400 K/uL   nRBC 0.0 0.0 - 0.2 %   Neutrophils Relative % 50 %   Neutro Abs 4.6 1.7 - 7.7 K/uL   Lymphocytes Relative 37 %   Lymphs Abs 3.2 0.7 - 4.0 K/uL   Monocytes Relative 10 %   Monocytes Absolute 0.9 0 - 1 K/uL   Eosinophils Relative 2 %   Eosinophils Absolute 0.1 0 - 0 K/uL   Basophils Relative 1 %   Basophils Absolute 0.1 0 - 0 K/uL   Immature Granulocytes 0 %   Abs Immature Granulocytes 0.02 0.00 - 0.07 K/uL  Blood gas, venous  Result Value Ref Range   pH, Ven 7.50 (H) 7.25 - 7.43   pCO2, Ven 32 (L) 44 - 60 mmHg   Bicarbonate 25.0 20.0 - 28.0 mmol/L   Acid-Base Excess 2.6 (H) 0.0 - 2.0 mmol/L   O2 Saturation 47.8 %   Patient temperature 37.0    Collection site LINE    Sample type VENOUS   Urinalysis, Complete w Microscopic  Result Value Ref Range   Color, Urine YELLOW (A) YELLOW   APPearance CLEAR (A) CLEAR   Specific Gravity, Urine 1.010 1.005 - 1.030   pH 7.0 5.0 - 8.0   Glucose, UA NEGATIVE NEGATIVE mg/dL   Hgb urine dipstick NEGATIVE NEGATIVE   Bilirubin Urine NEGATIVE NEGATIVE   Ketones, ur 5 (A) NEGATIVE mg/dL   Protein, ur NEGATIVE NEGATIVE mg/dL   Nitrite NEGATIVE NEGATIVE   Leukocytes,Ua NEGATIVE NEGATIVE   WBC, UA 11-20 0 - 5 WBC/hpf   Bacteria, UA RARE (A) NONE SEEN   Squamous Epithelial / LPF 0-5 0 - 5   Mucus PRESENT   Urine Drug Screen, Qualitative  Result Value Ref Range   Tricyclic, Ur Screen NONE DETECTED NONE DETECTED    Amphetamines, Ur Screen NONE DETECTED NONE DETECTED  MDMA (Ecstasy)Ur Screen NONE DETECTED NONE DETECTED   Cocaine Metabolite,Ur [place] NONE DETECTED NONE DETECTED   Opiate, Ur Screen NONE DETECTED NONE DETECTED   Phencyclidine (PCP) Ur S NONE DETECTED NONE DETECTED   Cannabinoid 50 Ng, Ur [place] POSITIVE (A) NONE DETECTED   Barbiturates, Ur Screen NONE DETECTED NONE DETECTED   Benzodiazepine, Ur Scrn NONE DETECTED NONE DETECTED   Methadone Scn, Ur NONE DETECTED NONE DETECTED  Lactic acid, plasma  Result Value Ref Range   Lactic Acid, Venous 1.6 0.5 - 1.9 mmol/L      Assessment & Plan:   Problem List Items Addressed This Visit    None    Visit Diagnoses    Acute non-recurrent maxillary sinusitis    -  Primary   Will treat with prednisone and doxycycline. Call if not getting better or getting worse. Continue to monitor.    Relevant Medications   predniSONE (DELTASONE) 50 MG tablet   doxycycline (VIBRA-TABS) 100 MG tablet       Follow up plan: Return if symptoms worsen or fail to improve.   . This visit was completed via MyChart due to the restrictions of the COVID-19 pandemic. All issues as above were discussed and addressed. Physical exam was done as above through visual confirmation on MyChart. If it was felt that the patient should be evaluated in the office, they were directed there. The patient verbally consented to this visit. . Location of the patient: home . Location of the provider: home . Those involved with this call:  . Provider: Olevia Perches, DO . CMA: Wilhemena Durie, CMA . Front Desk/Registration: Adela Ports  . Time spent on call: 15 minutes with patient face to face via video conference. More than 50% of this time was spent in counseling and coordination of care. 23 minutes total spent in review of patient's record and preparation of their chart.

## 2020-02-26 NOTE — Telephone Encounter (Signed)
   Notes to clinic: can this me resent please the pharmacy has not received this medication   Requested Prescriptions  Pending Prescriptions Disp Refills   doxycycline (VIBRA-TABS) 100 MG tablet 20 tablet 0    Sig: Take 1 tablet (100 mg total) by mouth 2 (two) times daily.      Off-Protocol Failed - 02/26/2020 10:56 AM      Failed - Medication not assigned to a protocol, review manually.      Passed - Valid encounter within last 12 months    Recent Outpatient Visits           Today Acute non-recurrent maxillary sinusitis   [facility] [place], [name], DO   2 weeks ago Bipolar disorder, current episode mixed, mild (HCC)   [facility] [place], [place] T, NP   3 months ago Generalized abdominal pain   [facility] [name] A, NP   3 months ago Type 2 diabetes mellitus without complication, without long-term current use of insulin (HCC)   [facility] [place], [place] T, NP   3 months ago Seizure disorder [facility])   [facility] [place], Dorie Rank, NP

## 2020-02-26 NOTE — Telephone Encounter (Signed)
This was originally sent to wrong office FYI

## 2020-02-26 NOTE — Telephone Encounter (Signed)
Medication: doxycycline (VIBRA-TABS) 100 MG tablet [[id number]] - can this me resent please the pharmacy has not received this medication  Has the patient contacted their pharmacy? YES (Agent: If no, request that the patient contact the pharmacy for the refill.) (Agent: If yes, when and what did the pharmacy advise?)  Preferred Pharmacy (with phone number or street name): [id number] - [place], [place] - [address]  Phone:  [phone] Fax:  [phone]     Agent: Please be advised that RX refills may take up to 3 business days. We ask that you follow-up with your pharmacy.

## 2020-02-26 NOTE — Telephone Encounter (Signed)
Routing to provider  

## 2020-03-03 ENCOUNTER — Telehealth: Payer: Self-pay | Admitting: Nurse Practitioner

## 2020-03-03 NOTE — Telephone Encounter (Signed)
Routing to provider to advise.  

## 2020-03-03 NOTE — Telephone Encounter (Signed)
Patient was told on a virtual visit if he didn't feel any better to give the office a call back, Patient states that he doesn't feel better but a lot worse. He would like a call back to [phone]. Please advise the patient.

## 2020-03-03 NOTE — Telephone Encounter (Signed)
Please schedule appointment per Dr. Laural Benes since patient is a lot worse.

## 2020-03-03 NOTE — Telephone Encounter (Signed)
Is he not a lot better, or is he a lot worse? A lot worse needs appt

## 2020-03-03 NOTE — Telephone Encounter (Signed)
Called pt to schedule, no answer, left vm 

## 2020-03-04 ENCOUNTER — Ambulatory Visit: Payer: Medicaid Other | Admitting: Nurse Practitioner

## 2020-03-04 ENCOUNTER — Telehealth (INDEPENDENT_AMBULATORY_CARE_PROVIDER_SITE_OTHER): Payer: Medicaid Other | Admitting: Family Medicine

## 2020-03-04 ENCOUNTER — Encounter: Payer: Self-pay | Admitting: Family Medicine

## 2020-03-04 VITALS — BP 122/91 | HR 99 | Temp 98.6°F | Ht 65.0 in | Wt 144.0 lb

## 2020-03-04 DIAGNOSIS — J0100 Acute maxillary sinusitis, unspecified: Secondary | ICD-10-CM | POA: Diagnosis not present

## 2020-03-04 DIAGNOSIS — S060X0A Concussion without loss of consciousness, initial encounter: Secondary | ICD-10-CM

## 2020-03-04 MED ORDER — HYDROXYZINE HCL 25 MG PO TABS: 25.0000 mg | ORAL_TABLET | Freq: Three times a day (TID) | ORAL | 3 refills | Status: AC | PRN

## 2020-03-04 MED ORDER — PREDNISONE 50 MG PO TABS: 50.0000 mg | ORAL_TABLET | Freq: Every day | ORAL | 0 refills | Status: DC

## 2020-03-04 MED ORDER — ALBUTEROL SULFATE HFA 108 (90 BASE) MCG/ACT IN AERS: 2.0000 | INHALATION_SPRAY | Freq: Four times a day (QID) | RESPIRATORY_TRACT | 1 refills | Status: AC | PRN

## 2020-03-04 NOTE — Progress Notes (Signed)
BP (!) 122/91   Pulse 99   Temp 98.6 F (37 C) (Oral)   Ht 5\' 5"  (1.651 m)   Wt 144 lb (65.3 kg)   BMI 23.96 kg/m    Subjective:    Patient ID: Michael Woodward, male    DOB: 1990/07/24, 29 y.o.   MRN: [id number]  HPI: Michael Woodward is a 29 y.o. male  Chief Complaint  Patient presents with  . Otitis Media    follow up / ears feel better  . Pneumonia    follow up  . Seizures    had seizure yesterday, has concussion per pt / cant see anything in chart   . Shortness of Breath    x1 week    He went to the ER after having a concussion and having a seizure yesterday. He went to the ER.   UPPER RESPIRATORY TRACT INFECTION- He feels like his head is clearer. He feels like he has some stuff in his chest. He has been having trouble breathing  Worst symptom: chest congestion Fever: no Cough: yes Shortness of breath: yes Wheezing: yes Chest pain: no Chest tightness: yes Chest congestion: yes Nasal congestion: no Runny nose: no Post nasal drip: no Sneezing: no Sore throat: no Swollen glands: no Sinus pressure: no Headache: no Face pain: no Toothache: no Ear pain: no  Ear pressure: no  Eyes red/itching:no Eye drainage/crusting: no  Vomiting: no Rash: no Fatigue: yes Sick contacts: no Strep contacts: no  Context: better Recurrent sinusitis: no Relief with OTC cold/cough medications: no  Treatments attempted: cold/sinus, mucinex, anti-histamine, pseudoephedrine and antibiotics   Relevant past medical, surgical, family and social history reviewed and updated as indicated. Interim medical history since our last visit reviewed. Allergies and medications reviewed and updated.  Review of Systems  Constitutional: Positive for chills, diaphoresis and fatigue. Negative for activity change, appetite change, fever and unexpected weight change.  HENT: Negative.   Respiratory: Positive for cough, chest tightness and shortness of breath. Negative for apnea, choking, wheezing and  stridor.   Cardiovascular: Negative.   Gastrointestinal: Negative.   Musculoskeletal: Negative.     Per HPI unless specifically indicated above     Objective:    BP (!) 122/91   Pulse 99   Temp 98.6 F (37 C) (Oral)   Ht 5\' 5"  (1.651 m)   Wt 144 lb (65.3 kg)   BMI 23.96 kg/m   Wt Readings from Last 3 Encounters:  03/04/20 144 lb (65.3 kg)  11/23/19 149 lb (67.6 kg)  11/20/19 157 lb (71.2 kg)    Physical Exam Vitals and nursing note reviewed.  Constitutional:      General: He is not in acute distress.    Appearance: Normal appearance. He is not ill-appearing, toxic-appearing or diaphoretic.  HENT:     Head: Normocephalic and atraumatic.     Right Ear: External ear normal.     Left Ear: External ear normal.     Nose: Nose normal.     Mouth/Throat:     Mouth: Mucous membranes are moist.     Pharynx: Oropharynx is clear.  Eyes:     General: No scleral icterus.       Right eye: No discharge.        Left eye: No discharge.     Conjunctiva/sclera: Conjunctivae normal.     Pupils: Pupils are equal, round, and reactive to light.  Pulmonary:     Effort: Pulmonary effort is normal. No respiratory distress.  Comments: Speaking in full sentences Musculoskeletal:        General: Normal range of motion.     Cervical back: Normal range of motion.  Skin:    Coloration: Skin is not jaundiced or pale.     Findings: No bruising, erythema, lesion or rash.  Neurological:     Mental Status: He is alert and oriented to person, place, and time. Mental status is at baseline.  Psychiatric:        Mood and Affect: Mood normal.        Behavior: Behavior normal.        Thought Content: Thought content normal.        Judgment: Judgment normal.     Results for orders placed or performed during the hospital encounter of 11/20/19  Comprehensive metabolic panel  Result Value Ref Range   Sodium 140 135 - 145 mmol/L   Potassium 3.3 (L) 3.5 - 5.1 mmol/L   Chloride 105 98 - 111 mmol/L     CO2 26 22 - 32 mmol/L   Glucose, Bld 91 70 - 99 mg/dL   BUN 8 6 - 20 mg/dL   Creatinine, Ser 8.09 0.61 - 1.24 mg/dL   Calcium 9.5 8.9 - 98.3 mg/dL   Total Protein 7.7 6.5 - 8.1 g/dL   Albumin 5.0 3.5 - 5.0 g/dL   AST 30 15 - 41 U/L   ALT 26 0 - 44 U/L   Alkaline Phosphatase 61 38 - 126 U/L   Total Bilirubin 0.9 0.3 - 1.2 mg/dL   GFR calc non Af Amer >60 >60 mL/min   GFR calc Af Amer >60 >60 mL/min   Anion gap 9 5 - 15  Ethanol  Result Value Ref Range   Alcohol, Ethyl (B) <10 <10 mg/dL  CBC with Differential  Result Value Ref Range   WBC 8.9 4.0 - 10.5 K/uL   RBC 5.45 4.22 - 5.81 MIL/uL   Hemoglobin 16.6 13.0 - 17.0 g/dL   HCT 38.2 39 - 52 %   MCV 83.3 80.0 - 100.0 fL   MCH 30.5 26.0 - 34.0 pg   MCHC 36.6 (H) 30.0 - 36.0 g/dL   RDW 50.5 39.7 - 67.3 %   Platelets 217 150 - 400 K/uL   nRBC 0.0 0.0 - 0.2 %   Neutrophils Relative % 50 %   Neutro Abs 4.6 1.7 - 7.7 K/uL   Lymphocytes Relative 37 %   Lymphs Abs 3.2 0.7 - 4.0 K/uL   Monocytes Relative 10 %   Monocytes Absolute 0.9 0 - 1 K/uL   Eosinophils Relative 2 %   Eosinophils Absolute 0.1 0 - 0 K/uL   Basophils Relative 1 %   Basophils Absolute 0.1 0 - 0 K/uL   Immature Granulocytes 0 %   Abs Immature Granulocytes 0.02 0.00 - 0.07 K/uL  Blood gas, venous  Result Value Ref Range   pH, Ven 7.50 (H) 7.25 - 7.43   pCO2, Ven 32 (L) 44 - 60 mmHg   Bicarbonate 25.0 20.0 - 28.0 mmol/L   Acid-Base Excess 2.6 (H) 0.0 - 2.0 mmol/L   O2 Saturation 47.8 %   Patient temperature 37.0    Collection site LINE    Sample type VENOUS   Urinalysis, Complete w Microscopic  Result Value Ref Range   Color, Urine YELLOW (A) YELLOW   APPearance CLEAR (A) CLEAR   Specific Gravity, Urine 1.010 1.005 - 1.030   pH 7.0 5.0 - 8.0  Glucose, UA NEGATIVE NEGATIVE mg/dL   Hgb urine dipstick NEGATIVE NEGATIVE   Bilirubin Urine NEGATIVE NEGATIVE   Ketones, ur 5 (A) NEGATIVE mg/dL   Protein, ur NEGATIVE NEGATIVE mg/dL   Nitrite NEGATIVE  NEGATIVE   Leukocytes,Ua NEGATIVE NEGATIVE   WBC, UA 11-20 0 - 5 WBC/hpf   Bacteria, UA RARE (A) NONE SEEN   Squamous Epithelial / LPF 0-5 0 - 5   Mucus PRESENT   Urine Drug Screen, Qualitative  Result Value Ref Range   Tricyclic, Ur Screen NONE DETECTED NONE DETECTED   Amphetamines, Ur Screen NONE DETECTED NONE DETECTED   MDMA (Ecstasy)Ur Screen NONE DETECTED NONE DETECTED   Cocaine Metabolite,Ur [place] NONE DETECTED NONE DETECTED   Opiate, Ur Screen NONE DETECTED NONE DETECTED   Phencyclidine (PCP) Ur S NONE DETECTED NONE DETECTED   Cannabinoid 50 Ng, Ur [place] POSITIVE (A) NONE DETECTED   Barbiturates, Ur Screen NONE DETECTED NONE DETECTED   Benzodiazepine, Ur Scrn NONE DETECTED NONE DETECTED   Methadone Scn, Ur NONE DETECTED NONE DETECTED  Lactic acid, plasma  Result Value Ref Range   Lactic Acid, Venous 1.6 0.5 - 1.9 mmol/L      Assessment & Plan:   Problem List Items Addressed This Visit    None    Visit Diagnoses    Acute non-recurrent maxillary sinusitis    -  Primary   Resolving, but not gone. Will do another burst of prednisone and start PRN albuterol. Call with any concerns. Continue to monitor.    Relevant Medications   predniSONE (DELTASONE) 50 MG tablet   Concussion without loss of consciousness, initial encounter       Advised him that I cannot evaluate his concussion vis a virtual visit and he would need to come in or see someone where he's currently living.        Follow up plan: Return 2 weeks follow up with Jolene.   . This visit was completed via MyChart due to the restrictions of the COVID-19 pandemic. All issues as above were discussed and addressed. Physical exam was done as above through visual confirmation on MyChart. If it was felt that the patient should be evaluated in the office, they were directed there. The patient verbally consented to this visit. . Location of the patient: home . Location of the provider: work . Those involved with this call:   . Provider: Olevia Perches, DO . CMA: Wilhemena Durie, CMA . Front Desk/Registration: Adela Ports  . Time spent on call: 15 minutes with patient face to face via video conference. More than 50% of this time was spent in counseling and coordination of care. 23 minutes total spent in review of patient's record and preparation of their chart.

## 2020-03-04 NOTE — Telephone Encounter (Signed)
Patient returned offices call to schedule appt and be worked in virtually today. Advised patient that there werent any opening today but offered first available on 9/21 or patient would need to go to an urgent care.Patient became very irate and the call had to be disconnected.

## 2020-03-04 NOTE — Telephone Encounter (Signed)
Called pt spoke with Dr. Laural Benes scheduled virtual today at 2:40

## 2020-03-13 ENCOUNTER — Telehealth: Payer: Medicaid Other | Admitting: Family

## 2020-03-13 DIAGNOSIS — K0889 Other specified disorders of teeth and supporting structures: Secondary | ICD-10-CM

## 2020-03-13 MED ORDER — IBUPROFEN 600 MG PO TABS: 600.0000 mg | ORAL_TABLET | Freq: Three times a day (TID) | ORAL | 0 refills | Status: DC | PRN

## 2020-03-13 MED ORDER — AMOXICILLIN 500 MG PO CAPS: 500.0000 mg | ORAL_CAPSULE | Freq: Three times a day (TID) | ORAL | 0 refills | Status: DC

## 2020-03-13 NOTE — Progress Notes (Signed)
E-Visit for Dental Pain  We are sorry that you are not feeling well.  Here is how we plan to help!  Based on what you have shared with me in the questionnaire, it sounds like you have an abscess tooth. Please make sure you keep your appointment with your dentist.   Ibuprofen 600mg  3 times a day for 7 days for discomfort and Amoxicillin 500mg  3 times per day for 10 days  It is imperative that you see a dentist within 10 days of this eVisit to determine the cause of the dental pain and be sure it is adequately treated  A toothache or tooth pain is caused when the nerve in the root of a tooth or surrounding a tooth is irritated. Dental (tooth) infection, decay, injury, or loss of a tooth are the most common causes of dental pain. Pain may also occur after an extraction (tooth is pulled out). Pain sometimes originates from other areas and radiates to the jaw, thus appearing to be tooth pain.Bacteria growing inside your mouth can contribute to gum disease and dental decay, both of which can cause pain. A toothache occurs from inflammation of the central portion of the tooth called pulp. The pulp contains nerve endings that are very sensitive to pain. Inflammation to the pulp or pulpitis may be caused by dental cavities, trauma, and infection.    HOME CARE:   For toothaches: . Over-the-counter pain medications such as acetaminophen or ibuprofen may be used. Take these as directed on the package while you arrange for a dental appointment. . Avoid very cold or hot foods, because they may make the pain worse. . You may get relief from biting on a cotton ball soaked in oil of cloves. You can get oil of cloves at most drug stores.  For jaw pain: .  Aspirin may be helpful for problems in the joint of the jaw in adults. . If pain happens every time you open your mouth widely, the temporomandibular joint (TMJ) may be the source of the pain. Yawning or taking a large bite of food may worsen the pain. An  appointment with your doctor or dentist will help you find the cause.     GET HELP RIGHT AWAY IF:  . You have a high fever or chills . If you have had a recent head or face injury and develop headache, light headedness, nausea, vomiting, or other symptoms that concern you after an injury to your face or mouth, you could have a more serious injury in addition to your dental injury. . A facial rash associated with a toothache: This condition may improve with medication. Contact your doctor for them to decide what is appropriate. . Any jaw pain occurring with chest pain: Although jaw pain is most commonly caused by dental disease, it is sometimes referred pain from other areas. People with heart disease, especially people who have had stents placed, people with diabetes, or those who have had heart surgery may have jaw pain as a symptom of heart attack or angina. If your jaw or tooth pain is associated with lightheadedness, sweating, or shortness of breath, you should see a doctor as soon as possible. . Trouble swallowing or excessive pain or bleeding from gums: If you have a history of a weakened immune system, diabetes, or steroid use, you may be more susceptible to infections. Infections can often be more severe and extensive or caused by unusual organisms. Dental and gum infections in people with these conditions may require  more aggressive treatment. An abscess may need draining or IV antibiotics, for example.  MAKE SURE YOU    Understand these instructions.  Will watch your condition.  Will get help right away if you are not doing well or get worse.  Thank you for choosing an e-visit. Your e-visit answers were reviewed by a board certified advanced clinical practitioner to complete your personal care plan. Depending upon the condition, your plan could have included both over the counter or prescription medications. Please review your pharmacy choice. Make sure the pharmacy is open so you  can pick up prescription now. If there is a problem, you may contact your provider through [employer] and have the prescription routed to another pharmacy. Your safety is important to [place]. If you have drug allergies check your prescription carefully.  For the next 24 hours you can use MyChart to ask questions about today's visit, request a non-urgent call back, or ask for a work or school excuse. You will get an email in the next two days asking about your experience. I hope that your e-visit has been valuable and will speed your recovery.  Approximately 5 minutes was spent documenting and reviewing patient's chart.

## 2020-03-13 NOTE — Progress Notes (Signed)
See previous Evisit 

## 2020-03-14 ENCOUNTER — Ambulatory Visit: Payer: Self-pay

## 2020-03-14 NOTE — Telephone Encounter (Signed)
Will need to see them tomorrow. I cannot adjust his medicines without an appointment.

## 2020-03-14 NOTE — Telephone Encounter (Signed)
Spoke with patient. He states that he stopped the Viibryd 2 days ago, symptoms are better but not completely gone. Patient states he has an appointment with [facility] tomorrow to establish care but cannot get any medications until 5 to 7 business days after initial appointment. Patient states he has been very stressed lately and would just like something to help bridge him until he can be seen with [facility]. He states he just needs/wants something to help stabilize his mood.

## 2020-03-14 NOTE — Telephone Encounter (Signed)
Spoke with Shanda Bumps, she agreed to see patient in the morning. Tried calling patient to advise him and get him scheduled for a mychart video, LVM asking for patient to please return my call.

## 2020-03-14 NOTE — Telephone Encounter (Signed)
Please advise 

## 2020-03-14 NOTE — Telephone Encounter (Signed)
Called and left a detailed VM letting patient know Dr. Henriette Combs advice. Asked for patient to call back or send mychart message letting us know that he received his voicemail. Will try to call again later to be sure patient got the message.

## 2020-03-14 NOTE — Telephone Encounter (Signed)
Patient called stating that he has side effects from his Vibryd.  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 He states that he was noticing that he has swelling in his hands fingers and ankles. He has twitching legs and leg cramps he has sweats. He states that he has read that these are from Vibryd. He has contacted his therapist but they told him to contact his PCP.  They will not help him until he is seen 9/28. He denies and swallowing issues. Per protocol note will be routed to office for evaluation and advice.   Reason for Disposition . [1] Caller has URGENT medicine question about med that PCP or specialist prescribed AND [2] triager unable to answer question  Answer Assessment - Initial Assessment Questions 1. NAME of MEDICATION: "What medicine are you calling about?"     vibryd 2. QUESTION: "What is your question?" (e.g., medication refill, side effect)    Having multable side effects 3. PRESCRIBING HCP: "Who prescribed it?" Reason: if prescribed by specialist, call should be referred to that group.    [place] 4. SYMPTOMS: "Do you have any symptoms?"     Swelling hands fingers and ankles, sweats, leg cramps, confusion not able to concentrate 5. SEVERITY: If symptoms are present, ask "Are they mild, moderate or severe?"     severe 6. PREGNANCY:  "Is there any chance that you are pregnant?" "When was your last menstrual period?"    N/A  Protocols used: MEDICATION QUESTION CALL-A-AH

## 2020-03-14 NOTE — Telephone Encounter (Signed)
ER

## 2020-03-14 NOTE — Telephone Encounter (Signed)
Patient is calling back- he has contacted the ED and they can not Rx medication for him unless he is inpatient.Patient has intake appointment tomorrow- but medication appointment will be 2 weeks out at new facility. Patient has quit Vibrid- he quit yesterday- reports SE are a little better today.(Worked great until 3-5 weeks ago- no longer effective) Patient wants to know if PCP can change medication until he can get into care. Patient would like call back or virtual appointment. Patient is out of town at this time. Call to office- patient transferred to [place].

## 2020-03-15 ENCOUNTER — Encounter: Payer: Self-pay | Admitting: Nurse Practitioner

## 2020-03-15 ENCOUNTER — Telehealth (INDEPENDENT_AMBULATORY_CARE_PROVIDER_SITE_OTHER): Payer: Medicaid Other | Admitting: Nurse Practitioner

## 2020-03-15 VITALS — Ht 65.0 in | Wt 135.0 lb

## 2020-03-15 DIAGNOSIS — F3161 Bipolar disorder, current episode mixed, mild: Secondary | ICD-10-CM | POA: Diagnosis not present

## 2020-03-15 MED ORDER — BUSPIRONE HCL 5 MG PO TABS: 5.0000 mg | ORAL_TABLET | Freq: Three times a day (TID) | ORAL | 0 refills | Status: AC | PRN

## 2020-03-15 MED ORDER — ALPRAZOLAM 0.5 MG PO TABS: 0.5000 mg | ORAL_TABLET | ORAL | 0 refills | Status: AC | PRN

## 2020-03-15 NOTE — Progress Notes (Signed)
MyChart Video Visit    Virtual Visit via Video Note   I discussed the limitations of evaluation and management by telemedicine and the availability of in person appointments. The patient expressed understanding and agreed to proceed.  Patient: Michael Woodward   DOB: Sep 25, 1990   29 y.o. Male  MRN: [id number] Visit Date: 03/15/2020  Today's healthcare provider: Valentino Nose, NP   Chief Complaint  Patient presents with  . Anxiety  . Depression   Subjective    HPI  Anxiety, Follow-up  He was last seen for anxiety 1 months ago. Changes made at last visit include no changes.   He reports poor compliance with treatment. He reports poor tolerance of treatment. He is having side effects. More anxious, nervous, and mood swings. 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 He states that he was noticing that he has swelling in his hands fingers and ankles. He has twitching legs and leg cramps he has sweats. He states that he has read that these are from Vibryd.   Patient reports previous good control of his mood on Viibryd, however for the 4 weeks has developed side effects.  Stopped Viibryd 3 day ago and the side effects have gotten better.  He is still having multiple symptoms but attributes these to his anxiety and panic attacks.  He states he needs something to get him through the next 10-12 days and can get in with the provider who prescribes medication with Psychiatry.  Patient reports he has a new patient appointment with a behavioral health specialist later today.   He feels his anxiety is severe and Worse since last visit.  Symptoms: No chest pain Yes difficulty concentrating  Yes dizziness Yes fatigue  Yes feelings of losing control Yes insomnia  Yes irritable Yes palpitations  Yes panic attacks Yes racing thoughts  No shortness of breath Yes sweating  Yes tremors/shakes    GAD-7 Results GAD-7 Generalized Anxiety Disorder Screening Tool 03/15/2020 02/10/2020  11/12/2019  1. Feeling Nervous, Anxious, or on Edge 3 1 3   2. Not Being Able to Stop or Control Worrying 3 1 3   3. Worrying Too Much About Different Things 3 1 3   4. Trouble Relaxing 3 1 3   5. Being So Restless it's Hard To Sit Still 3 0 3  6. Becoming Easily Annoyed or Irritable 3 3 3   7. Feeling Afraid As If Something Awful Might Happen 1 1 3   Total GAD-7 Score 19 8 21   Difficulty At Work, Home, or Getting  Along With Others? Extremely difficult Very difficult Very difficult    PHQ-9 Scores PHQ9 SCORE ONLY 03/15/2020 02/10/2020 11/12/2019  PHQ-9 Total Score 21 15 5     ---------------------------------------------------------------------------------------------------   Patient Active Problem List   Diagnosis Date Noted  . Nicotine dependence, cigarettes, uncomplicated 11/19/2019  . Marijuana use 11/19/2019  . Generalized abdominal pain 11/19/2019  . Screen for STD (sexually transmitted disease) 11/19/2019  . Migraines 03/06/2019  . Type 2 diabetes mellitus without complication, without long-term current use of insulin (HCC) 01/26/2019  . Seizure disorder (HCC) 01/26/2019  . Bipolar disorder (HCC) 01/26/2019  . Essential hypertension 01/26/2019   Social History   Tobacco Use  . Smoking status: Current Every Day Smoker    Packs/day: 0.50    Types: Cigarettes  . Smokeless tobacco: Never Used  Vaping Use  . Vaping Use: Former  Substance Use Topics  . Alcohol use: Yes    Comment: on occasion  .  Drug use: Yes    Types: Marijuana   Allergies  Allergen Reactions  . Viibryd [Vilazodone Hcl] Anxiety  . Phenergan [Promethazine Hcl]     Cause seizures       Medications: Outpatient Medications Prior to Visit  Medication Sig  . albuterol (VENTOLIN HFA) 108 (90 Base) MCG/ACT inhaler Inhale 2 puffs into the lungs every 6 (six) hours as needed for wheezing or shortness of breath.  . cloNIDine (CATAPRES) 0.1 MG tablet Take 1 tablet (0.1 mg total) by mouth 2 (two) times daily as  needed (for panic attack).  . gabapentin (NEURONTIN) 400 MG capsule Take 1 capsule (400 mg total) by mouth 3 (three) times daily.  Marland Kitchen lamoTRIgine (LAMICTAL) 200 MG tablet Take 1 tablet (200 mg total) by mouth 2 (two) times daily.  Marland Kitchen lisinopril (ZESTRIL) 10 MG tablet Take 1 tablet (10 mg total) by mouth daily. May increase to 20 MG daily if BP over next week >130/80.  . Multiple Vitamin (MULTIVITAMIN PO) Take 1 Dose by mouth daily.  Marland Kitchen omeprazole (PRILOSEC) 40 MG capsule Take 40 mg by mouth daily.  . ondansetron (ZOFRAN ODT) 4 MG disintegrating tablet Take 1 tablet (4 mg total) by mouth every 8 (eight) hours as needed.  . Probiotic Product (PROBIOTIC PO) Take by mouth.  . hydrOXYzine (ATARAX/VISTARIL) 25 MG tablet Take 1 tablet (25 mg total) by mouth 3 (three) times daily as needed.  Marland Kitchen VIIBRYD 20 MG TABS Take 1 tablet (20 mg total) by mouth daily with breakfast. (Patient not taking: Reported on 03/15/2020)  . [DISCONTINUED] ALPRAZolam (XANAX) 0.5 MG tablet Take 1 tablet (0.5 mg total) by mouth as needed for anxiety (once daily as needed for SEVERE anxiety only). (Patient not taking: Reported on 03/04/2020)  . [DISCONTINUED] amoxicillin (AMOXIL) 500 MG capsule Take 1 capsule (500 mg total) by mouth 3 (three) times daily. (Patient not taking: Reported on 03/15/2020)  . [DISCONTINUED] cyclobenzaprine (FLEXERIL) 10 MG tablet Take 1 tablet (10 mg total) by mouth 3 (three) times daily as needed for muscle spasms. (Patient not taking: Reported on 03/04/2020)  . [DISCONTINUED] doxycycline (VIBRA-TABS) 100 MG tablet Take 1 tablet (100 mg total) by mouth 2 (two) times daily.  . [DISCONTINUED] ibuprofen (ADVIL) 600 MG tablet Take 1 tablet (600 mg total) by mouth every 8 (eight) hours as needed. (Patient not taking: Reported on 03/15/2020)  . [DISCONTINUED] metoCLOPramide (REGLAN) 10 MG tablet Take 1 tablet (10 mg total) by mouth every 6 (six) hours as needed. (Patient not taking: Reported on 02/10/2020)  .  [DISCONTINUED] predniSONE (DELTASONE) 50 MG tablet Take 1 tablet (50 mg total) by mouth daily with breakfast.  . [DISCONTINUED] QUEtiapine (SEROQUEL) 100 MG tablet Take 1 tablet (100 mg total) by mouth daily. (Patient not taking: Reported on 03/04/2020)  . [DISCONTINUED] rizatriptan (MAXALT) 5 MG tablet TAKE 1 TABLET BY MOUTH AS NEEDED FOR MIGRAINE. MAY REPEAT IN 2 HOURS IF NEEDED (Patient not taking: Reported on 03/04/2020)  . [DISCONTINUED] sucralfate (CARAFATE) 1 g tablet Take 1 tablet (1 g total) by mouth 4 (four) times daily. (Patient not taking: Reported on 03/04/2020)   No facility-administered medications prior to visit.    Review of Systems  Constitutional: Positive for activity change, appetite change and fatigue.  Respiratory: Positive for chest tightness and shortness of breath.   Cardiovascular: Positive for chest pain and palpitations.  Gastrointestinal: Negative.   Musculoskeletal: Negative.   Skin: Negative.   Neurological: Positive for dizziness and tremors.  Psychiatric/Behavioral: Positive for agitation, behavioral  problems, confusion, decreased concentration, dysphoric mood and sleep disturbance. The patient is nervous/anxious and is hyperactive.      Objective    Ht 5\' 5"  (1.651 m)   Wt 135 lb (61.2 kg)   BMI 22.47 kg/m  BP Readings from Last 3 Encounters:  03/04/20 (!) 122/91  11/23/19 (!) 127/92  11/21/19 (!) 88/58   Wt Readings from Last 3 Encounters:  03/15/20 135 lb (61.2 kg)  03/04/20 144 lb (65.3 kg)  11/23/19 149 lb (67.6 kg)    Physical Exam Vitals and nursing note reviewed.  Constitutional:      General: He is not in acute distress.    Appearance: Normal appearance. He is not toxic-appearing.  Eyes:     General: No scleral icterus.    Extraocular Movements: Extraocular movements intact.  Cardiovascular:     Comments: Unable to assess heart sounds via virtual visit. Pulmonary:     Effort: Pulmonary effort is normal. No respiratory distress.      Comments: Unable to assess lung sounds via virtual visit; patient talking in complete sentences. Abdominal:     Comments: Unable to assess bowel sounds via virtual visit.  Skin:    Coloration: Skin is not jaundiced or pale.     Findings: No erythema.  Neurological:     General: No focal deficit present.     Mental Status: He is alert and oriented to person, place, and time.  Psychiatric:        Attention and Perception: He is inattentive.        Mood and Affect: Mood is anxious. Mood is not depressed. Affect is not angry, tearful or inappropriate.        Speech: Speech is rapid and pressured. Speech is not delayed or slurred.        Behavior: Behavior is agitated and hyperactive. Behavior is cooperative.        Thought Content: Thought content normal.        Cognition and Memory: Memory normal.      Assessment & Plan    1. Bipolar disorder, current episode mixed, mild (HCC) Chronic, uncontrolled.  Patient recently stopped Viibryd due to side effects.  Has appointment with behavioral health specialist later today and hoping to get in with provider that can prescribe medication for behavioral health within the next 2 weeks.  Will trial Buspar 5 mg up to 3 times daily as needed for anxiety.  Refilled prescription for alprazolam 0.5 mg once daily as needed for severe anxiety #20 no refills.  PDMP reviewed and appropriate.  Educated to only use this for severe panic episodes.  Patient to return to clinic as needed with goal of new behavioral health team taking over all psychiatry medications.   Return if symptoms worsen or fail to improve.     Due to the catastrophic nature of the COVID-19 pandemic, this visit was completed via audio and visual contact via [employer] due to the restrictions of the COVID-19 pandemic. All issues as above were discussed and addressed. Physical exam was done as above through visual confirmation on [employer]. If it was felt that the patient should be evaluated in  the office, they were directed there. The patient verbally consented to this visit."} . Location of the patient: home . Location of the provider: work . Those involved with this call:  . Provider: Mardene CelesteJessica Asaro, DNP . CMA: Rondel BatonSulibeya Dimas, CMA . Front Desk/Registration: Adela Portshristan Williamson  . Time spent on call: 21 minutes with patient face  to face via video conference. More than 50% of this time was spent in counseling and coordination of care. 30 minutes total spent in review of patient's record and preparation of their chart.  I verified patient identity using two factors (patient name and date of birth). Patient consents verbally to being seen via telemedicine visit today.   I have reviewed this encounter including the documentation in this note. I am certifying that I agree with the content of this note as primary care provider.   Valentino Nose, NP [facility] [phone] (phone) [phone] (fax)  [facility]

## 2020-03-15 NOTE — Patient Instructions (Signed)
Buspirone tablets What is this medicine? BUSPIRONE (byoo SPYE rone) is used to treat anxiety disorders. This medicine may be used for other purposes; ask your health care provider or pharmacist if you have questions. COMMON BRAND NAME(S): BuSpar What should I tell my health care provider before I take this medicine? They need to know if you have any of these conditions:  kidney or liver disease  an unusual or allergic reaction to buspirone, other medicines, foods, dyes, or preservatives  pregnant or trying to get pregnant  breast-feeding How should I use this medicine? Take this medicine by mouth with a glass of water. Follow the directions on the prescription label. You may take this medicine with or without food. To ensure that this medicine always works the same way for you, you should take it either always with or always without food. Take your doses at regular intervals. Do not take your medicine more often than directed. Do not stop taking except on the advice of your doctor or health care professional. Talk to your pediatrician regarding the use of this medicine in children. Special care may be needed. Overdosage: If you think you have taken too much of this medicine contact a poison control center or emergency room at once. NOTE: This medicine is only for you. Do not share this medicine with others. What if I miss a dose? If you miss a dose, take it as soon as you can. If it is almost time for your next dose, take only that dose. Do not take double or extra doses. What may interact with this medicine? Do not take this medicine with any of the following medications:  linezolid  MAOIs like Carbex, Eldepryl, Marplan, Nardil, and Parnate  methylene blue  procarbazine This medicine may also interact with the following medications:  diazepam  digoxin  diltiazem  erythromycin  grapefruit juice  haloperidol  medicines for mental depression or mood problems  medicines  for seizures like carbamazepine, phenobarbital and phenytoin  nefazodone  other medications for anxiety  rifampin  ritonavir  some antifungal medicines like itraconazole, ketoconazole, and voriconazole  verapamil  warfarin This list may not describe all possible interactions. Give your health care provider a list of all the medicines, herbs, non-prescription drugs, or dietary supplements you use. Also tell them if you smoke, drink alcohol, or use illegal drugs. Some items may interact with your medicine. What should I watch for while using this medicine? Visit your doctor or health care professional for regular checks on your progress. It may take 1 to 2 weeks before your anxiety gets better. You may get drowsy or dizzy. Do not drive, use machinery, or do anything that needs mental alertness until you know how this drug affects you. Do not stand or sit up quickly, especially if you are an older patient. This reduces the risk of dizzy or fainting spells. Alcohol can make you more drowsy and dizzy. Avoid alcoholic drinks. What side effects may I notice from receiving this medicine? Side effects that you should report to your doctor or health care professional as soon as possible:  blurred vision or other vision changes  chest pain  confusion  difficulty breathing  feelings of hostility or anger  muscle aches and pains  numbness or tingling in hands or feet  ringing in the ears  skin rash and itching  vomiting  weakness Side effects that usually do not require medical attention (report to your doctor or health care professional if they continue or   are bothersome):  disturbed dreams, nightmares  headache  nausea  restlessness or nervousness  sore throat and nasal congestion  stomach upset This list may not describe all possible side effects. Call your doctor for medical advice about side effects. You may report side effects to FDA at 1-800-FDA-1088. Where should I  keep my medicine? Keep out of the reach of children. Store at room temperature below 30 degrees C (86 degrees F). Protect from light. Keep container tightly closed. Throw away any unused medicine after the expiration date. NOTE: This sheet is a summary. It may not cover all possible information. If you have questions about this medicine, talk to your doctor, pharmacist, or health care provider.  2020 Elsevier/Gold Standard (2010-01-12 18:06:11)  

## 2020-03-15 NOTE — Telephone Encounter (Signed)
Spoke to patient. Virtual scheduled for this morning with Shanda Bumps.

## 2020-03-28 ENCOUNTER — Telehealth (INDEPENDENT_AMBULATORY_CARE_PROVIDER_SITE_OTHER): Payer: Medicaid Other | Admitting: Nurse Practitioner

## 2020-03-28 ENCOUNTER — Encounter: Payer: Self-pay | Admitting: Nurse Practitioner

## 2020-03-28 DIAGNOSIS — F3161 Bipolar disorder, current episode mixed, mild: Secondary | ICD-10-CM

## 2020-03-28 MED ORDER — RIZATRIPTAN BENZOATE 5 MG PO TABS: ORAL_TABLET | ORAL | 0 refills | Status: DC

## 2020-03-28 NOTE — Progress Notes (Signed)
There were no vitals taken for this visit.   Subjective:    Patient ID: Michael Woodward, male    DOB: 12/20/1990, 29 y.o.   MRN: [id number]  HPI: Michael Woodward is a 29 y.o. male  Chief Complaint  Patient presents with   Follow-up    Bipolar disorder last ov 03/15/2020, trial of Buspar 5mg  up to 3 times daily as needed.     This visit was completed via telephone due to the restrictions of the COVID-19 pandemic. All issues as above were discussed and addressed but no physical exam was performed. If it was felt that the patient should be evaluated in the office, they were directed there. The patient verbally consented to this visit. Patient was unable to complete an audio/visual visit due to Technical difficulties,Lack of internet. Due to the catastrophic nature of the COVID-19 pandemic, this visit was done through audio contact only.  Location of the patient: home  Location of the provider: work  Those involved with this call:   Provider: , DNP  CMA: Aura Dials, CMA  Front Desk/Registration: Wilhemena Durie   Time spent on call: 20 minutes on the phone discussing health concerns. 15 minutes total spent in review of patient's record and preparation of their chart.   I verified patient identity using two factors (patient name and date of birth). Patient consents verbally to being seen via telemedicine visit today.   BIPOLAR DISORDER Is for follow-up 03/15/20.  He came off Viibryd and is currently on Celexa, changed to this by psychiatry at recent visit -- this was [place]  Sees psychiatry in 3 weeks.  Taking Buspar 3 times a day.  Denies racing thoughts or SI/HI. Has used Trazodone before, but this was not beneficial.He reports Xanax has been helping and only uses for major anxiety, is aware this is only short term treatment, last fill on 03/15/20 for 20 pills. He is his mom's power ofattorney at this  time.His grandmother he is also caring for as is in last stages of COPD and dementia.He feels better current with Celexa then he did with Viibryd.  Is doing online therapy. Duration:stable Anxious mood:yes Excessive worrying:yes Irritability:no Sweating:no Nausea:no Palpitations:no Hyperventilation:no Panic attacks:yes Agoraphobia:no Obscessions/compulsions:no Depressed mood:yes Depression screen [facility] 2/9 03/28/2020 03/15/2020 02/10/2020 11/12/2019 10/14/2019  Decreased Interest 1 3 1 1 3   Down, Depressed, Hopeless 1 3 3 1 3   PHQ - 2 Score 2 6 4 2 6   Altered sleeping 0 1 2 0 1  Tired, decreased energy 1 3 3 1 3   Change in appetite 1 3 2  0 2  Feeling bad or failure about yourself  1 3 3 1 3   Trouble concentrating 1 3 1 1 3   Moving slowly or fidgety/restless 0 2 0 0 0  Suicidal thoughts 0 0 0 0 0  PHQ-9 Score 6 21 15 5 18   Difficult doing work/chores Somewhat difficult Extremely dIfficult Very difficult Somewhat difficult Very difficult   GAD 7 : Generalized Anxiety Score 03/28/2020 03/15/2020 02/10/2020 11/12/2019  Nervous, Anxious, on Edge 1 3 1 3   Control/stop worrying 3 3 1 3   Worry too much - different things 3 3 1 3   Trouble relaxing 3 3 1 3   Restless 3 3 0 3  Easily annoyed or irritable 2 3 3 3   Afraid - awful might happen 1 1 1 3   Total GAD 7 Score 16 19 8  21  Anxiety Difficulty Very difficult Extremely difficult Very difficult Very difficult   Relevant past medical, surgical, family and social history reviewed and updated as indicated. Interim medical history since our last visit reviewed. Allergies and medications reviewed and updated.  Review of Systems  Constitutional: Negative for activity change, diaphoresis, fatigue and fever.  Respiratory: Negative for cough, chest tightness, shortness of breath and wheezing.   Cardiovascular: Negative for chest pain, palpitations and leg swelling.  Gastrointestinal: Negative.   Neurological: Negative.     Psychiatric/Behavioral: Positive for decreased concentration and sleep disturbance. Negative for self-injury and suicidal ideas. The patient is nervous/anxious.     Per HPI unless specifically indicated above     Objective:    There were no vitals taken for this visit.  Wt Readings from Last 3 Encounters:  03/15/20 135 lb (61.2 kg)  03/04/20 144 lb (65.3 kg)  11/23/19 149 lb (67.6 kg)    Physical Exam   Unable to perform due to telephone visit only.  Results for orders placed or performed during the hospital encounter of 11/20/19  Comprehensive metabolic panel  Result Value Ref Range   Sodium 140 135 - 145 mmol/L   Potassium 3.3 (L) 3.5 - 5.1 mmol/L   Chloride 105 98 - 111 mmol/L   CO2 26 22 - 32 mmol/L   Glucose, Bld 91 70 - 99 mg/dL   BUN 8 6 - 20 mg/dL   Creatinine, Ser 1.44 0.61 - 1.24 mg/dL   Calcium 9.5 8.9 - 81.8 mg/dL   Total Protein 7.7 6.5 - 8.1 g/dL   Albumin 5.0 3.5 - 5.0 g/dL   AST 30 15 - 41 U/L   ALT 26 0 - 44 U/L   Alkaline Phosphatase 61 38 - 126 U/L   Total Bilirubin 0.9 0.3 - 1.2 mg/dL   GFR calc non Af Amer >60 >60 mL/min   GFR calc Af Amer >60 >60 mL/min   Anion gap 9 5 - 15  Ethanol  Result Value Ref Range   Alcohol, Ethyl (B) <10 <10 mg/dL  CBC with Differential  Result Value Ref Range   WBC 8.9 4.0 - 10.5 K/uL   RBC 5.45 4.22 - 5.81 MIL/uL   Hemoglobin 16.6 13.0 - 17.0 g/dL   HCT 56.3 39 - 52 %   MCV 83.3 80.0 - 100.0 fL   MCH 30.5 26.0 - 34.0 pg   MCHC 36.6 (H) 30.0 - 36.0 g/dL   RDW 14.9 70.2 - 63.7 %   Platelets 217 150 - 400 K/uL   nRBC 0.0 0.0 - 0.2 %   Neutrophils Relative % 50 %   Neutro Abs 4.6 1.7 - 7.7 K/uL   Lymphocytes Relative 37 %   Lymphs Abs 3.2 0.7 - 4.0 K/uL   Monocytes Relative 10 %   Monocytes Absolute 0.9 0.1 - 1.0 K/uL   Eosinophils Relative 2 %   Eosinophils Absolute 0.1 0 - 0 K/uL   Basophils Relative 1 %   Basophils Absolute 0.1 0 - 0 K/uL   Immature Granulocytes 0 %   Abs Immature Granulocytes 0.02  0.00 - 0.07 K/uL  Blood gas, venous  Result Value Ref Range   pH, Ven 7.50 (H) 7.25 - 7.43   pCO2, Ven 32 (L) 44 - 60 mmHg   Bicarbonate 25.0 20.0 - 28.0 mmol/L   Acid-Base Excess 2.6 (H) 0.0 - 2.0 mmol/L   O2 Saturation 47.8 %   Patient temperature 37.0    Collection site LINE  Sample type VENOUS   Urinalysis, Complete w Microscopic  Result Value Ref Range   Color, Urine YELLOW (A) YELLOW   APPearance CLEAR (A) CLEAR   Specific Gravity, Urine 1.010 1.005 - 1.030   pH 7.0 5.0 - 8.0   Glucose, UA NEGATIVE NEGATIVE mg/dL   Hgb urine dipstick NEGATIVE NEGATIVE   Bilirubin Urine NEGATIVE NEGATIVE   Ketones, ur 5 (A) NEGATIVE mg/dL   Protein, ur NEGATIVE NEGATIVE mg/dL   Nitrite NEGATIVE NEGATIVE   Leukocytes,Ua NEGATIVE NEGATIVE   WBC, UA 11-20 0 - 5 WBC/hpf   Bacteria, UA RARE (A) NONE SEEN   Squamous Epithelial / LPF 0-5 0 - 5   Mucus PRESENT   Urine Drug Screen, Qualitative  Result Value Ref Range   Tricyclic, Ur Screen NONE DETECTED NONE DETECTED   Amphetamines, Ur Screen NONE DETECTED NONE DETECTED   MDMA (Ecstasy)Ur Screen NONE DETECTED NONE DETECTED   Cocaine Metabolite,Ur [place] NONE DETECTED NONE DETECTED   Opiate, Ur Screen NONE DETECTED NONE DETECTED   Phencyclidine (PCP) Ur S NONE DETECTED NONE DETECTED   Cannabinoid 50 Ng, Ur  POSITIVE (A) NONE DETECTED   Barbiturates, Ur Screen NONE DETECTED NONE DETECTED   Benzodiazepine, Ur Scrn NONE DETECTED NONE DETECTED   Methadone Scn, Ur NONE DETECTED NONE DETECTED  Lactic acid, plasma  Result Value Ref Range   Lactic Acid, Venous 1.6 0.5 - 1.9 mmol/L      Assessment & Plan:   Problem List Items Addressed This Visit      Other   Bipolar disorder (HCC)    Ongoing, continue current medication regimen.  Continue collaboration with psychiatry, medication changes as prescribed by them. He denies SI/HI.  Have highly recommended he continue to work on establishing care in [facility], since is living there at this time.          I discussed the assessment and treatment plan with the patient. The patient was provided an opportunity to ask questions and all were answered. The patient agreed with the plan and demonstrated an understanding of the instructions.   The patient was advised to call back or seek an in-person evaluation if the symptoms worsen or if the condition fails to improve as anticipated.   I provided 21+ minutes of time during this encounter.  Follow up plan: Return if symptoms worsen or fail to improve.

## 2020-03-28 NOTE — Patient Instructions (Signed)

## 2020-03-28 NOTE — Assessment & Plan Note (Signed)
Ongoing, continue current medication regimen.  Continue collaboration with psychiatry, medication changes as prescribed by them. He denies SI/HI.  Have highly recommended he continue to work on establishing care in [facility], since is living there at this time.

## 2020-03-31 ENCOUNTER — Other Ambulatory Visit: Payer: Self-pay | Admitting: Family Medicine

## 2020-03-31 NOTE — Telephone Encounter (Signed)
Requested medication (s) are due for refill today - unknown  Requested medication (s) are on the active medication list -yes  Future visit scheduled -no  Last refill: 02/26/20  Notes to clinic: Request non delegated Rx- outside provider  Requested Prescriptions  Pending Prescriptions Disp Refills   ondansetron (ZOFRAN-ODT) 4 MG disintegrating tablet [Pharmacy Med Name: ONDANSETRON ODT 4MG  TABLETS] 20 tablet 0    Sig: DISSOLVE 1 TABLET(4 MG) ON THE TONGUE EVERY 8 HOURS AS NEEDED FOR NAUSEA OR VOMITING      Not Delegated - Gastroenterology: Antiemetics Failed - 03/31/2020  8:19 AM      Failed - This refill cannot be delegated      Passed - Valid encounter within last 6 months    Recent Outpatient Visits           3 days ago Bipolar disorder, current episode mixed, mild (HCC)   [name], [name], NP   2 weeks ago Bipolar disorder, current episode mixed, mild (HCC)   [name], NP   3 weeks ago Acute non-recurrent maxillary sinusitis   [name], [name], DO   1 month ago Acute non-recurrent maxillary sinusitis   [name], [name], DO   1 month ago Bipolar disorder, current episode mixed, mild (HCC)   [name], [name], NP                  Requested Prescriptions  Pending Prescriptions Disp Refills   ondansetron (ZOFRAN-ODT) 4 MG disintegrating tablet [Pharmacy Med Name: ONDANSETRON ODT 4MG  TABLETS] 20 tablet 0    Sig: DISSOLVE 1 TABLET(4 MG) ON THE TONGUE EVERY 8 HOURS AS NEEDED FOR NAUSEA OR VOMITING      Not Delegated - Gastroenterology: Antiemetics Failed - 03/31/2020  8:19 AM      Failed - This refill cannot be delegated      Passed - Valid encounter within last 6 months    Recent Outpatient Visits           3 days ago Bipolar disorder, current episode mixed, mild (HCC)   [name], [name], NP   2 weeks  ago Bipolar disorder, current episode mixed, mild (HCC)   [facility] 04/02/2020 A, NP   3 weeks ago Acute non-recurrent maxillary sinusitis   [name], [name], DO   1 month ago Acute non-recurrent maxillary sinusitis   [name], [name], DO   1 month ago Bipolar disorder, current episode mixed, mild (HCC)   [name], [name], NP

## 2020-05-17 ENCOUNTER — Telehealth: Payer: Self-pay | Admitting: Nurse Practitioner

## 2020-05-17 ENCOUNTER — Other Ambulatory Visit: Payer: Self-pay | Admitting: Nurse Practitioner

## 2020-05-17 MED ORDER — ACCU-CHEK GUIDE W/DEVICE KIT: PACK | 0 refills | Status: AC

## 2020-05-17 MED ORDER — ACCU-CHEK SOFTCLIX LANCETS MISC: 12 refills | Status: AC

## 2020-05-17 MED ORDER — ACCU-CHEK GUIDE VI STRP: ORAL_STRIP | 12 refills | Status: AC

## 2020-05-17 MED ORDER — COMFORT TOUCH BP CUFF/MEDIUM MISC: 1.0000 | Freq: Every day | 0 refills | Status: AC

## 2020-05-17 NOTE — Telephone Encounter (Signed)
Pts care management team advised him to call PCP and advise that Pt needs a BP cuff and a glucometer for his diabetes / he needs this sent to the closest medical supply company in/to [place] or [place]/ please advise

## 2020-05-17 NOTE — Telephone Encounter (Signed)
Sent to [facility] in [place], [place].

## 2020-05-17 NOTE — Telephone Encounter (Signed)
See note below

## 2020-05-17 NOTE — Telephone Encounter (Signed)
Called pt to let him know of Jolene's message no answer left detailed vm

## 2020-05-18 NOTE — Telephone Encounter (Signed)
Called pt to make sure that he got my vm yesterday, pt states that he did

## 2020-11-01 ENCOUNTER — Other Ambulatory Visit: Payer: Self-pay | Admitting: Nurse Practitioner

## 2020-12-20 ENCOUNTER — Other Ambulatory Visit: Payer: Self-pay | Admitting: Nurse Practitioner

## 2020-12-20 NOTE — Telephone Encounter (Signed)
Requested medication (s) are due for refill today: yes   Requested medication (s) are on the active medication list:  yes   Last refill:  09/22/2020  Future visit scheduled: no  Notes to clinic:  this refill cannot be delegated    Requested Prescriptions  Pending Prescriptions Disp Refills   lamoTRIgine (LAMICTAL) 200 MG tablet [Pharmacy Med Name: LAMOTRIGINE 200MG  TABLETS] 180 tablet 4    Sig: TAKE 1 TABLET(200 MG) BY MOUTH TWICE DAILY      Not Delegated - Neurology:  Anticonvulsants Failed - 12/20/2020 11:15 AM      Failed - This refill cannot be delegated      Failed - HCT in normal range and within 360 days    HCT  Date Value Ref Range Status  11/21/2019 45.4 39.0 - 52.0 % Final   Hematocrit  Date Value Ref Range Status  07/10/2019 47.8 37.5 - 51.0 % Final          Failed - HGB in normal range and within 360 days    Hemoglobin  Date Value Ref Range Status  11/21/2019 16.6 13.0 - 17.0 g/dL Final  01/21/2020 21/30/8657 13.0 - 17.7 g/dL Final          Failed - PLT in normal range and within 360 days    Platelets  Date Value Ref Range Status  11/21/2019 217 150 - 400 K/uL Final  07/10/2019 222 150 - 450 x10E3/uL Final          Failed - WBC in normal range and within 360 days    WBC  Date Value Ref Range Status  11/21/2019 8.9 4.0 - 10.5 K/uL Final          Passed - Valid encounter within last 12 months    Recent Outpatient Visits           8 months ago Bipolar disorder, current episode mixed, mild (HCC)   [facility] [place], [name], NP   9 months ago Bipolar disorder, current episode mixed, mild (HCC)   [name], NP   9 months ago Acute non-recurrent maxillary sinusitis   [facility] [place], [name], DO   9 months ago Acute non-recurrent maxillary sinusitis   [facility] [place], [name], DO   10 months ago Bipolar disorder, current episode mixed, mild (HCC)   [facility] [place], [place], NP

## 2020-12-22 ENCOUNTER — Other Ambulatory Visit: Payer: Self-pay | Admitting: Nurse Practitioner

## 2020-12-22 NOTE — Telephone Encounter (Signed)
Requested medication (s) are due for refill today:  yes  Requested medication (s) are on the active medication list: yes   Last refill:  09/22/2020  Future visit scheduled: no  Notes to clinic:  this refill cannot be delegated LOV 03/2020   Requested Prescriptions  Pending Prescriptions Disp Refills   lamoTRIgine (LAMICTAL) 200 MG tablet [Pharmacy Med Name: LAMOTRIGINE 200MG  TABLETS] 180 tablet 4    Sig: TAKE 1 TABLET(200 MG) BY MOUTH TWICE DAILY      Not Delegated - Neurology:  Anticonvulsants Failed - 12/22/2020  8:40 AM      Failed - This refill cannot be delegated      Failed - HCT in normal range and within 360 days    HCT  Date Value Ref Range Status  11/21/2019 45.4 39.0 - 52.0 % Final   Hematocrit  Date Value Ref Range Status  07/10/2019 47.8 37.5 - 51.0 % Final          Failed - HGB in normal range and within 360 days    Hemoglobin  Date Value Ref Range Status  11/21/2019 16.6 13.0 - 17.0 g/dL Final  01/21/2020 64/33/2951 13.0 - 17.7 g/dL Final          Failed - PLT in normal range and within 360 days    Platelets  Date Value Ref Range Status  11/21/2019 217 150 - 400 K/uL Final  07/10/2019 222 150 - 450 x10E3/uL Final          Failed - WBC in normal range and within 360 days    WBC  Date Value Ref Range Status  11/21/2019 8.9 4.0 - 10.5 K/uL Final          Passed - Valid encounter within last 12 months    Recent Outpatient Visits           8 months ago Bipolar disorder, current episode mixed, mild (HCC)   [facility] [place], [name], NP   9 months ago Bipolar disorder, current episode mixed, mild (HCC)   [name], NP   9 months ago Acute non-recurrent maxillary sinusitis   [facility] [place], [name], DO   10 months ago Acute non-recurrent maxillary sinusitis   [facility] [place], [name], DO   10 months ago Bipolar disorder, current episode mixed, mild (HCC)   [facility] [place], [place], NP

## 2020-12-26 NOTE — Telephone Encounter (Signed)
Called pt he has moved and has a new pcp

## 2020-12-26 NOTE — Telephone Encounter (Signed)
Duplicate pt has moved and changed pcp

## 2021-06-25 ENCOUNTER — Other Ambulatory Visit: Payer: Self-pay | Admitting: Family Medicine

## 2021-06-25 NOTE — Telephone Encounter (Signed)
Requested medication (s) are due for refill today: yes  Requested medication (s) are on the active medication list: yes  Last refill:  03/04/20 18 g 1 RF  Future visit scheduled: no  Notes to clinic:  per chart review pt moved and no longer with practice - please review for accuracy   Requested Prescriptions  Pending Prescriptions Disp Refills   PROAIR HFA 108 (90 Base) MCG/ACT inhaler [Pharmacy Med Name: PROAIR HFA ORAL INH (200  PFS) 8.5G] 8.5 g     Sig: INHALE 2 PUFFS INTO THE LUNGS EVERY 6 HOURS AS NEEDED FOR WHEEZING OR SHORTNESS OF BREATH     Pulmonology:  Beta Agonists Failed - 06/25/2021  1:11 PM      Failed - One inhaler should last at least one month. If the patient is requesting refills earlier, contact the patient to check for uncontrolled symptoms.      Failed - Valid encounter within last 12 months    Recent Outpatient Visits           1 year ago Bipolar disorder, current episode mixed, mild (HCC)   [facility] [place], [place] T, NP   1 year ago Bipolar disorder, current episode mixed, mild (HCC)   [facility] [place], NP   1 year ago Acute non-recurrent maxillary sinusitis   [facility] [place], [place], DO   1 year ago Acute non-recurrent maxillary sinusitis   [facility] [place], [name], DO   1 year ago Bipolar disorder, current episode mixed, mild (HCC)   [facility] [place], Dorie Rank, NP

## 2021-06-26 NOTE — Telephone Encounter (Signed)
Confirmed, pt has moved
